# Patient Record
Sex: Male | Born: 1955
Health system: Southern US, Community
[De-identification: ages and names within clinical notes are randomized; demographics above are authoritative.]

## PROBLEM LIST (undated history)

## (undated) DIAGNOSIS — M797 Fibromyalgia: Secondary | ICD-10-CM

## (undated) DIAGNOSIS — E782 Mixed hyperlipidemia: Secondary | ICD-10-CM

## (undated) DIAGNOSIS — I10 Essential (primary) hypertension: Secondary | ICD-10-CM

## (undated) HISTORY — PX: FOOT SURGERY: SHX648

## (undated) HISTORY — PX: HERNIA REPAIR: SHX51

---

## 2006-02-12 ENCOUNTER — Ambulatory Visit: Payer: Self-pay | Admitting: Internal Medicine

## 2006-03-19 ENCOUNTER — Ambulatory Visit: Payer: Self-pay | Admitting: Internal Medicine

## 2006-03-29 ENCOUNTER — Ambulatory Visit: Payer: Self-pay | Admitting: Internal Medicine

## 2006-04-20 ENCOUNTER — Ambulatory Visit: Payer: Self-pay | Admitting: Internal Medicine

## 2006-04-27 ENCOUNTER — Ambulatory Visit: Payer: Self-pay | Admitting: Internal Medicine

## 2006-05-04 ENCOUNTER — Ambulatory Visit: Payer: Self-pay | Admitting: Internal Medicine

## 2006-06-05 ENCOUNTER — Ambulatory Visit: Payer: Self-pay | Admitting: Internal Medicine

## 2006-07-27 ENCOUNTER — Ambulatory Visit: Payer: Self-pay | Admitting: Internal Medicine

## 2010-07-24 ENCOUNTER — Ambulatory Visit: Payer: Self-pay | Admitting: Oncology

## 2010-07-30 LAB — CBC WITH DIFFERENTIAL/PLATELET
BASO%: 0.7 % (ref 0.0–2.0)
HCT: 38.5 % (ref 38.4–49.9)
LYMPH%: 29.3 % (ref 14.0–49.0)
MCH: 31.5 pg (ref 27.2–33.4)
MCHC: 34.5 g/dL (ref 32.0–36.0)
MCV: 91.2 fL (ref 79.3–98.0)
MONO#: 0.6 10*3/uL (ref 0.1–0.9)
MONO%: 7.8 % (ref 0.0–14.0)
NEUT%: 54.1 % (ref 39.0–75.0)
Platelets: 291 10*3/uL (ref 140–400)
WBC: 7.1 10*3/uL (ref 4.0–10.3)

## 2010-07-30 LAB — CHCC SMEAR

## 2010-08-01 LAB — COMPREHENSIVE METABOLIC PANEL
ALT: 13 U/L (ref 0–53)
CO2: 27 mEq/L (ref 19–32)
Calcium: 9.3 mg/dL (ref 8.4–10.5)
Chloride: 100 mEq/L (ref 96–112)
Creatinine, Ser: 0.86 mg/dL (ref 0.40–1.50)
Sodium: 136 mEq/L (ref 135–145)
Total Protein: 6.7 g/dL (ref 6.0–8.3)

## 2010-08-01 LAB — LACTATE DEHYDROGENASE: LDH: 162 U/L (ref 94–250)

## 2010-08-05 ENCOUNTER — Encounter
Admission: RE | Admit: 2010-08-05 | Discharge: 2010-08-05 | Payer: Self-pay | Admitting: Physical Medicine and Rehabilitation

## 2011-03-13 NOTE — Assessment & Plan Note (Signed)
[facility]                                   ON-CALL NOTE   [name], [name]                        MRN:          [id number]  DATE:06/05/2006                            DOB:          1955-11-27    PHONE NUMBER:  [phone]   PRIMARY CARE PHYSICIAN:  Valetta Mole. Swords, MD   SUBJECTIVE:  Phone call was at about 10:30 a.m. on June 05, 2006.  Mr.  Alexander Reyes is concerned because he is having some abdominal or inguinal  sharp pain.  He describes a tubular structure that he has noticed come in  and out and also Dr. [name] who he is seeing for headache has seen.  It is  giving him some more pain today, but he does describe that he is able to  push it back into his abdomen.   PLAN:  It clearly sounds like an inguinal hernia.  Since he is having some  pain we are going to go ahead and see him in the office today at 12:45 p.m.                                   Karie Schwalbe, MD   RIL/MedQ  DD:  06/05/2006  DT:  06/06/2006  Job #:  [id number]   cc:   Valetta Mole. Swords, MD

## 2014-05-17 ENCOUNTER — Other Ambulatory Visit: Payer: Self-pay | Admitting: Infectious Diseases

## 2014-05-17 DIAGNOSIS — M545 Low back pain, unspecified: Secondary | ICD-10-CM

## 2014-05-23 ENCOUNTER — Ambulatory Visit
Admission: RE | Admit: 2014-05-23 | Discharge: 2014-05-23 | Disposition: A | Discharge status: Home / Self Care | Payer: BC Managed Care – PPO | Source: Ambulatory Visit | Attending: Infectious Diseases | Admitting: Infectious Diseases

## 2014-05-23 DIAGNOSIS — M545 Low back pain, unspecified: Secondary | ICD-10-CM

## 2015-06-27 ENCOUNTER — Other Ambulatory Visit: Payer: Self-pay | Admitting: Physician Assistant

## 2015-06-27 ENCOUNTER — Ambulatory Visit
Admission: RE | Admit: 2015-06-27 | Discharge: 2015-06-27 | Disposition: A | Discharge status: Home / Self Care | Payer: BLUE CROSS/BLUE SHIELD | Source: Ambulatory Visit | Attending: Physician Assistant | Admitting: Physician Assistant

## 2015-06-27 DIAGNOSIS — M542 Cervicalgia: Secondary | ICD-10-CM

## 2016-01-23 ENCOUNTER — Emergency Department (HOSPITAL_COMMUNITY): Payer: BLUE CROSS/BLUE SHIELD

## 2016-01-23 ENCOUNTER — Emergency Department (HOSPITAL_COMMUNITY)
Admission: EM | Admit: 2016-01-23 | Discharge: 2016-01-23 | Disposition: A | Discharge status: Home / Self Care | Payer: BLUE CROSS/BLUE SHIELD | Attending: Emergency Medicine | Admitting: Emergency Medicine

## 2016-01-23 ENCOUNTER — Encounter (HOSPITAL_COMMUNITY): Payer: Self-pay | Admitting: Emergency Medicine

## 2016-01-23 DIAGNOSIS — Z79899 Other long term (current) drug therapy: Secondary | ICD-10-CM | POA: Insufficient documentation

## 2016-01-23 DIAGNOSIS — R000 Tachycardia, unspecified: Secondary | ICD-10-CM | POA: Diagnosis present

## 2016-01-23 DIAGNOSIS — M797 Fibromyalgia: Secondary | ICD-10-CM | POA: Insufficient documentation

## 2016-01-23 DIAGNOSIS — E782 Mixed hyperlipidemia: Secondary | ICD-10-CM | POA: Diagnosis not present

## 2016-01-23 DIAGNOSIS — I10 Essential (primary) hypertension: Secondary | ICD-10-CM | POA: Insufficient documentation

## 2016-01-23 DIAGNOSIS — I4891 Unspecified atrial fibrillation: Secondary | ICD-10-CM

## 2016-01-23 HISTORY — DX: Fibromyalgia: M79.7

## 2016-01-23 HISTORY — DX: Mixed hyperlipidemia: E78.2

## 2016-01-23 HISTORY — DX: Essential (primary) hypertension: I10

## 2016-01-23 LAB — CBC
HEMATOCRIT: 50.9 % (ref 39.0–52.0)
HEMOGLOBIN: 18.2 g/dL — AB (ref 13.0–17.0)
MCH: 33.5 pg (ref 26.0–34.0)
MCHC: 35.8 g/dL (ref 30.0–36.0)
MCV: 93.6 fL (ref 78.0–100.0)
Platelets: 290 10*3/uL (ref 150–400)
RBC: 5.44 MIL/uL (ref 4.22–5.81)
RDW: 12.1 % (ref 11.5–15.5)
WBC: 7.5 10*3/uL (ref 4.0–10.5)

## 2016-01-23 LAB — BASIC METABOLIC PANEL
ANION GAP: 11 (ref 5–15)
BUN: 12 mg/dL (ref 6–20)
CHLORIDE: 105 mmol/L (ref 101–111)
CO2: 25 mmol/L (ref 22–32)
Calcium: 9.6 mg/dL (ref 8.9–10.3)
Creatinine, Ser: 1.06 mg/dL (ref 0.61–1.24)
GFR calc Af Amer: >60 mL/min (ref >60)
GLUCOSE: 111 mg/dL — AB (ref 65–99)
POTASSIUM: 4.3 mmol/L (ref 3.5–5.1)
SODIUM: 141 mmol/L (ref 135–145)

## 2016-01-23 LAB — PROTIME-INR
INR: 1.02 (ref 0.00–1.49)
Prothrombin Time: 13.6 seconds (ref 11.6–15.2)

## 2016-01-23 LAB — TROPONIN I

## 2016-01-23 LAB — MAGNESIUM: Magnesium: 2 mg/dL (ref 1.7–2.4)

## 2016-01-23 MED ORDER — DILTIAZEM LOAD VIA INFUSION
20.0000 mg | Freq: Once | INTRAVENOUS | Status: AC
  Administered 2016-01-23: 20 mg via INTRAVENOUS
  Filled 2016-01-23: qty 20

## 2016-01-23 MED ORDER — DILTIAZEM HCL 60 MG PO TABS: 60.0000 mg | ORAL_TABLET | Freq: Once | ORAL | Status: DC | PRN

## 2016-01-23 MED ORDER — METOPROLOL SUCCINATE ER 100 MG PO TB24: 50.0000 mg | ORAL_TABLET | Freq: Every day | ORAL | Status: DC

## 2016-01-23 MED ORDER — DILTIAZEM HCL 100 MG IV SOLR
5.0000 mg/h | INTRAVENOUS | Status: DC
  Administered 2016-01-23: 5 mg/h via INTRAVENOUS
  Filled 2016-01-23: qty 100

## 2016-01-23 NOTE — ED Provider Notes (Addendum)
CSN: [id number]     Arrival date & time 01/23/16  1148 History   First MD Initiated Contact with Patient 01/23/16 1149     Chief Complaint  Patient presents with  . Tachycardia   HPI Patient presents to the emergency room with complaints of atrial fibrillation. The patient states over the last couple of weeks. He's noticed some intermittent palpitations.  He felt like his heart has been racing off and on. He denies any trouble with any chest pain or shortness of breath. No fevers or chills. He went to his primary care doctor's office today and was noted to be in a tachycardic rhythm.  EKG suggesting atrial fibrillation. The doctor's office called EMS he was given a dose of 20 mg Cardizem IV.  Patient denies any prior history of irregular heart rhythms. He denies frequent or daily alcohol use Past Medical History  Diagnosis Date  . Hypertension   . Fibromyalgia   . Mixed dyslipidemia    Past Surgical History  Procedure Laterality Date  . Hernia repair     No family history on file. Social History  Substance Use Topics  . Smoking status: None  . Smokeless tobacco: None  . Alcohol Use: Yes     Comment: occasional    Review of Systems  All other systems reviewed and are negative.     Allergies  Review of patient's allergies indicates no known allergies.  Home Medications   Prior to Admission medications   Medication Sig Start Date End Date Taking? Authorizing Provider  amLODipine (NORVASC) 10 MG tablet Take 10 mg by mouth daily.   Yes Historical Provider, MD  atorvastatin (LIPITOR) 40 MG tablet Take 40 mg by mouth daily.   Yes Historical Provider, MD  COMBIGAN 0.2-0.5 % ophthalmic solution Place 1 drop into both eyes 2 (two) times daily. 11/26/15  Yes Historical Provider, MD  cyclobenzaprine (FLEXERIL) 5 MG tablet Take 5 mg by mouth daily as needed for muscle spasms.   Yes Historical Provider, MD  divalproex (DEPAKOTE ER) 250 MG 24 hr tablet Take 500 mg by mouth 2 (two) times  daily as needed.   Yes Historical Provider, MD  hydrochlorothiazide (HYDRODIURIL) 12.5 MG tablet Take 12.5 mg by mouth daily.   Yes Historical Provider, MD  lisinopril (PRINIVIL,ZESTRIL) 40 MG tablet Take 40 mg by mouth daily.   Yes Historical Provider, MD  metoprolol succinate (TOPROL-XL) 50 MG 24 hr tablet Take 50 mg by mouth daily. Take with or immediately following a meal.   Yes Historical Provider, MD  Omega-3 Fatty Acids (FISH OIL) 1000 MG CAPS Take 1,000 mg by mouth daily.   Yes Historical Provider, MD   BP 112/89 mmHg  Pulse 119  Temp(Src) 98.4 F (36.9 C) (Oral)  Resp 24  SpO2 95% Physical Exam  Constitutional: He appears well-developed and well-nourished. No distress.  HENT:  Head: Normocephalic and atraumatic.  Right Ear: External ear normal.  Left Ear: External ear normal.  Eyes: Conjunctivae are normal. Right eye exhibits no discharge. Left eye exhibits no discharge. No scleral icterus.  Neck: Neck supple. No tracheal deviation present. No thyromegaly present.  Cardiovascular: Intact distal pulses.  An irregularly irregular rhythm present. Tachycardia present.   Pulmonary/Chest: Effort normal and breath sounds normal. No stridor. No respiratory distress. He has no wheezes. He has no rales.  Abdominal: Soft. Bowel sounds are normal. He exhibits no distension. There is no tenderness. There is no rebound and no guarding.  Musculoskeletal: He exhibits no edema  or tenderness.  Neurological: He is alert. He has normal strength. No cranial nerve deficit (no facial droop, extraocular movements intact, no slurred speech) or sensory deficit. He exhibits normal muscle tone. He displays no seizure activity. Coordination normal.  Skin: Skin is warm and dry. No rash noted.  Psychiatric: He has a normal mood and affect.  Nursing note and vitals reviewed.   ED Course  Procedures (including critical care time)  CRITICAL CARE Performed by: AVWUJ,WJX Total critical care time: 35  minutes Critical care time was exclusive of separately billable procedures and treating other patients. Critical care was necessary to treat or prevent imminent or life-threatening deterioration. Critical care was time spent personally by me on the following activities: development of treatment plan with patient and/or surrogate as well as nursing, discussions with consultants, evaluation of patient's response to treatment, examination of patient, obtaining history from patient or surrogate, ordering and performing treatments and interventions, ordering and review of laboratory studies, ordering and review of radiographic studies, pulse oximetry and re-evaluation of patient's condition.  Labs Review Labs Reviewed  CBC - Abnormal; Notable for the following:    Hemoglobin 18.2 (*)    All other components within normal limits  BASIC METABOLIC PANEL - Abnormal; Notable for the following:    Glucose, Bld 111 (*)    All other components within normal limits  PROTIME-INR  TROPONIN I  MAGNESIUM    Imaging Review Dg Chest Port 1 View  01/23/2016  CLINICAL DATA:  Atrial fibrillation for approximately 2.5 weeks EXAM: PORTABLE CHEST 1 VIEW COMPARISON:  July 21, 2010 chest CT FINDINGS: There is no edema or consolidation. Heart is upper normal in size with pulmonary vascularity within normal limits. No adenopathy. No bone lesions. IMPRESSION: No edema or consolidation. Electronically Signed   By: Bretta Bang III M.D.   On: 01/23/2016 12:10   I have personally reviewed and evaluated these images and lab results as part of my medical decision-making.   EKG Interpretation   Date/Time:  Thursday January 23 2016 11:48:21 EDT Ventricular Rate:  154 PR Interval:    QRS Duration: 100 QT Interval:  283 QTC Calculation: 453 R Axis:   88 Text Interpretation:  Atrial fibrillation Borderline low voltage,  extremity leads No old tracing to compare Confirmed by Wong Steadham  MD-J, Nakyra Bourn  ([id number]) on 01/23/2016  12:01:32 PM      EKG Interpretation  Date/Time:  Thursday January 23 2016 14:58:53 EDT Ventricular Rate:  90 PR Interval:  151 QRS Duration: 102 QT Interval:  347 QTC Calculation: 424 R Axis:   90 Text Interpretation:  Sinus rhythm Borderline right axis deviation Borderline low voltage, extremity leads Baseline wander in lead(s) V1 atrial fibrillation resolved Confirmed by Lavanna Rog  MD-J, Jerrod Damiano ([id number]) on 01/23/2016 3:05:16 PM        MDM   Final diagnoses:  Atrial fibrillation with rapid ventricular response (HCC)    A fib noted on ECG.  Pt appears stable.  Will proceed with ED evaluation.  1426  Patient remains in A. fib. He is persistently tachycardic although slightly decreased since arrival.  He is tolerating the Cardizem drip. Blood pressure remained stable. He is currently at 15 mg an hour. We'll continue to monitor closely.  I will consult with cardiology for admission and further evaluation.     Linwood Dibbles, MD 01/23/16 1428  Pt has converted to NSR.  Rate is now 90.  Will dc Cardizem drip.  Chads Vasc score  = 1.  Pt  appears stable at this point for outpatient follow up .  Will discuss with cardiology about anticoagulation and follow up in the office.  D/w Dr Anne Fu.  Will increase his metoprolol to .  Follow up in a fib clinic.  Will have him take an aspirin daily and ultimately decide on anticoagulation with the cardiologist in the office.  Linwood Dibbles, MD 01/23/16 [phone]

## 2016-01-23 NOTE — Discharge Instructions (Signed)
Atrial Fibrillation °Atrial fibrillation is a type of heartbeat that is irregular or fast (rapid). If you have this condition, your heart keeps quivering in a weird (chaotic) way. This condition can make it so your heart cannot pump blood normally. Having this condition gives a person more risk for stroke, heart failure, and other heart problems. There are different types of atrial fibrillation. Talk with your doctor to learn about the type that you have. °HOME CARE °· Take over-the-counter and prescription medicines only as told by your doctor. °· If your doctor prescribed a blood-thinning medicine, take it exactly as told. Taking too much of it can cause bleeding. If you do not take enough of it, you will not have the protection that you need against stroke and other problems. °· Do not use any tobacco products. These include cigarettes, chewing tobacco, and e-cigarettes. If you need help quitting, ask your doctor. °· If you have apnea (obstructive sleep apnea), manage it as told by your doctor. °· Do not drink alcohol. °· Do not drink beverages that have caffeine. These include coffee, soda, and tea. °· Maintain a healthy weight. Do not use diet pills unless your doctor says they are safe for you. Diet pills may make heart problems worse. °· Follow diet instructions as told by your doctor. °· Exercise regularly as told by your doctor. °· Keep all follow-up visits as told by your doctor. This is important. °GET HELP IF: °· You notice a change in the speed, rhythm, or strength of your heartbeat. °· You are taking a blood-thinning medicine and you notice more bruising. °· You get tired more easily when you move or exercise. °GET HELP RIGHT AWAY IF: °· You have pain in your chest or your belly (abdomen). °· You have sweating or weakness. °· You feel sick to your stomach (nauseous). °· You notice blood in your throw up (vomit), poop (stool), or pee (urine). °· You are short of breath. °· You suddenly have swollen feet  and ankles. °· You feel dizzy. °· Your suddenly get weak or numb in your face, arms, or legs, especially if it happens on one side of your body. °· You have trouble talking, trouble understanding, or both. °· Your face or your eyelid droops on one side. °These symptoms may be an emergency. Do not wait to see if the symptoms will go away. Get medical help right away. Call your local emergency services (911 in the [place] Do not drive yourself to the hospital. °  °This information is not intended to replace advice given to you by your health care provider. Make sure you discuss any questions you have with your health care provider. °  °Document Released: 07/21/2008 Document Revised: 07/03/2015 Document Reviewed: 02/06/2015 °Elsevier Interactive Patient Education ©2016 Elsevier Inc. ° °

## 2016-01-23 NOTE — ED Notes (Signed)
Per EMS, pt presents with intermittent palpatations x 2 weeks. Pt went to PCP today and was found to be in a-fib with a rate of 150+. EMS gave pt 20mg  bolus of Cardizem with minimal change in HR. Pt alert x4. NAD at this time.

## 2016-01-23 NOTE — ED Notes (Signed)
Pt converted to NSR, EKG captured and showed to Dr. Lynelle DoctorKnapp.

## 2016-01-30 ENCOUNTER — Ambulatory Visit (INDEPENDENT_AMBULATORY_CARE_PROVIDER_SITE_OTHER): Payer: BLUE CROSS/BLUE SHIELD | Admitting: Cardiovascular Disease

## 2016-01-30 ENCOUNTER — Encounter: Payer: Self-pay | Admitting: Cardiovascular Disease

## 2016-01-30 VITALS — BP 136/106 | HR 64 | Ht 68.9 in | Wt 173.4 lb

## 2016-01-30 DIAGNOSIS — I480 Paroxysmal atrial fibrillation: Secondary | ICD-10-CM

## 2016-01-30 DIAGNOSIS — E785 Hyperlipidemia, unspecified: Secondary | ICD-10-CM

## 2016-01-30 DIAGNOSIS — R9431 Abnormal electrocardiogram [ECG] [EKG]: Secondary | ICD-10-CM | POA: Diagnosis not present

## 2016-01-30 DIAGNOSIS — I10 Essential (primary) hypertension: Secondary | ICD-10-CM

## 2016-01-30 NOTE — Patient Instructions (Signed)
Medication Instructions: Your physician recommends that you continue on your current medications as directed. Please refer to the Current Medication list given to you today.  Labwork: NONE  Testing/Procedures: 1. Echocardiogram - Your physician has requested that you have an echocardiogram. Echocardiography is a painless test that uses sound waves to create images of your heart. It provides your doctor with information about the size and shape of your heart and how well your heart's chambers and valves are working. This procedure takes approximately one hour. There are no restrictions for this procedure. This will be done at [address] location - [address], [address].  2. Exercise Myoview - Your physician has requested that you have en exercise stress myoview. For further information please visit [email or web address]. Please follow instruction sheet, as given. HOLD Metoprolol the day of your stress test.  3. 24-hour Holter monitor - Your physician has recommended that you wear a holter monitor. Holter monitors are medical devices that record the heart's electrical activity. Doctors most often use these monitors to diagnose arrhythmias. Arrhythmias are problems with the speed or rhythm of the heartbeat. The monitor is a small, portable device. You can wear one while you do your normal daily activities. This is usually used to diagnose what is causing palpitations/syncope (passing out). [facility] you have the echocardiogram.  Follow-up: Dr Royann Shiversroitoru recommends that you schedule a follow-up appointment in 2-3 weeks.  If you need a refill on your cardiac medications before your next appointment, please call your pharmacy.

## 2016-01-30 NOTE — Progress Notes (Signed)
Cardiology Consultation Note    Date:  01/30/2016   ID:  Alexander Reyes, DOB [birth year], MRN [id number] Consult requested by: Levester Fresh. Earlene Plater, MD PCP:  Mickie Hillier, MD  Cardiologist:   Thurmon Fair, MD   Chief Complaint  Patient presents with  . 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 He mentioned very mild palpitations during a routine medical visits and his electrocardiogram showed atrial fibrillation with rapid response with rates up to 200 bpm. After receiving intravenous diltiazem in the emergency room his rate became slower and he eventually converted to normal sinus rhythm and was discharged from the emergency room. He never experienced dizziness, syncope, shortness of breath or angina pectoris. He was recovering from an upper respiratory tract infection around that time. He remembers having very brief palpitations, isolated to one or 2 beats in the past. He has never had sustained palpitations before  He has a long-standing history of systemic hypertension dating back at least 20 years. He has strong family history of hypertension on both maternal and paternal sides. Roughly 15 years ago his routine electrocardiogram, performed as part of yearly physicals for hilar license showed "T-wave inversion". He remembers going to [facility] and undergoing a stress test and possibly also an echocardiogram. The results were normal and he was released back to flying he has never had a myocardial infarction, congestive heart failure, stroke, peripheral embolic events, TIA or other major serious medical problems. He does take a statin for hyperlipidemia. He does not smoke he does not have diabetes mellitus.  He has  been keeping occasional records of his blood pressure with a home monitor, which has reportedly been found to be accurate in an office visit in the past. The typical blood pressure would be around 135-140/90 with frequent episodes of diastolic blood pressure in excess of 90 mmHg even up to 100 mmHg. On arrival to the office today his blood pressure is 136/106 m Hg, when I rechecked it it was 140/100 mmHg. His dose of metoprolol was doubled after his emergency room visit.  He is active and fit. He walks 2 miles in 30 minutes at least twice a week, sometimes more often. He denies exertional dyspnea or angina.  He is currently on a 30 day leave until his medical condition is clarified.  Past Medical History  Diagnosis Date  . Hypertension   . Fibromyalgia   . Mixed dyslipidemia     Past Surgical History  Procedure Laterality Date  . Hernia repair      Current Medications: Outpatient Prescriptions Prior to Visit  Medication Sig Dispense Refill  . amLODipine (NORVASC) 10 MG tablet Take 10 mg by mouth daily.    Marland Kitchen atorvastatin (LIPITOR) 40 MG tablet Take 40 mg by mouth daily.    . COMBIGAN 0.2-0.5 % ophthalmic solution Place 1 drop into both eyes 2 (two) times daily.    Marland Kitchen diltiazem (CARDIZEM) 60 MG tablet Take 1 tablet (60 mg total) by mouth once as needed (for episodes of rapid heart rate as discussed). 10 tablet 0  . hydrochlorothiazide (HYDRODIURIL) 12.5 MG tablet Take 12.5 mg by mouth daily.    Marland Kitchen lisinopril (PRINIVIL,ZESTRIL) 40 MG tablet Take 40 mg by  mouth daily.    . metoprolol succinate (TOPROL-XL) 100 MG 24 hr tablet Take 1 tablet (100 mg total) by mouth daily. Take with or immediately following a meal. 30 tablet 1  . Omega-3 Fatty Acids (FISH OIL) 1000 MG CAPS Take 1,000 mg by mouth daily.    . cyclobenzaprine (FLEXERIL) 5 MG tablet Take 5 mg by mouth daily as needed for muscle spasms. Reported on 01/30/2016    . divalproex (DEPAKOTE ER) 250 MG 24 hr tablet Take 500 mg by mouth 2 (two)  times daily as needed. Reported on 01/30/2016     No facility-administered medications prior to visit.     Allergies:   Review of patient's allergies indicates no known allergies.   Social History   Social History  . Marital Status: Single    Spouse Name: N/A  . Number of Children: N/A  . Years of Education: N/A   Social History Main Topics  . Smoking status: Never Smoker   . Smokeless tobacco: Never Used  . Alcohol Use: 0.0 oz/week    0 Standard drinks or equivalent per week     Comment: occasional  . Drug Use: None  . Sexual Activity: Not Asked   Other Topics Concern  . None   Social History Narrative     Family History:  The patient's family history includes Heart attack in his father; Hypertension in his father and mother; Kidney disease in his brother, father, and sister.   ROS:   Please see the history of present illness.    ROS All other systems reviewed and are negative.   PHYSICAL EXAM:   VS:  BP 136/106 mmHg  Pulse 64  Ht 5' 8.9" (1.75 m)  Wt 78.642 kg (173 lb 6 oz)  BMI 25.68 kg/m2   GEN: Well nourished, well developed, in no acute distress HEENT: normal Neck: no JVD, carotid bruits, or masses Cardiac: RRR; no murmurs, rubs, or gallops,no edema  Respiratory:  clear to auscultation bilaterally, normal work of breathing GI: soft, nontender, nondistended, + BS MS: no deformity or atrophy Skin: warm and dry, no rash Neuro:  Alert and Oriented x 3, Strength and sensation are intact Psych: euthymic mood, full affect  Wt Readings from Last 3 Encounters:  01/30/16 78.642 kg (173 lb 6 oz)      Studies/Labs Reviewed:   EKG:  EKG is ordered today.  The ekg ordered today demonstrates Sinus rhythm. Voltage is low in the limb leads, but in the precordial leads seen meets criteria for left ventricular hypertrophy. ST segment depression with asymmetrical T-wave inversion is seen in the lateral leads in a pattern suggestive of left ventricular hypertrophy. There  is very subtle broadening of the QRS 100 ms. QTC normal 402 ms. There is no convincing evidence for left atrial abnormality.   Recent Labs: 01/23/2016: BUN 12; Creatinine, Ser 1.06; Hemoglobin 18.2*; Magnesium 2.0; Platelets 290; Potassium 4.3; Sodium 141   Lipid Panel No results found for: CHOL, TRIG, HDL, CHOLHDL, VLDL, LDLCALC, LDLDIRECT  Additional studies/ records that were reviewed today include:  ED visit notes  ASSESSMENT:    1. Paroxysmal atrial fibrillation (HCC)   2. Essential hypertension   3. Hyperlipidemia   4. Abnormal resting ECG findings      PLAN:  In order of problems listed above:   1. AFib: Beta blocker dose was increased after his acute event and hopefully this will provide improved rate control during future episodes of arrhythmia. Antiarrhythmic medications do not appear to  be justified since this was his first clinical event, he was minimally symptomatic. This patients CHA2DS2-VASc Score and unadjusted Ischemic Stroke Rate (% per year) is equal to 0.6 % stroke rate/year from a score of 1. From a medical point of view, guidelines to suggest that aspirin may be sufficient for stroke prophylaxis. However looking through pilot credentialing medical guidelines that he provided me, it seems that any additional risk factors for embolic events would demand treatment with a full anticoagulant. 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 He has never had any serious bleeding problems. Also recommended for his workup is a 24 Holter monitor in order to maintain licensure. 2. HTN: He has ECG evidence suggestive of left ventricular hypertrophy and hypertensive cardiomyopathy is the most likely underlying substrate for his  atrial fibrillation. We'll check an echocardiogram to look for left ventricular hypertrophy and measure left atrial size. His physical exam suggests normal left ventricular systolic function and the absence of serious valvular abnormalities. If he has severe left atrial enlargement, this would probably push towards recommendation for full anticoagulation as well. Blood pressure control is borderline at best. To some degree this may be related to anxiety about his new medical condition. No changes are made to his medications today. Note that his beta blocker dose was increased very recently. Would probably wait another week before making additional changes to antihypertensives. The logical change would be to increase the hydrochlorothiazide to 25 mg daily if necessary. 3. HLP: He is on a highly effective statin and a reasonably high dose. I don't have his most recent lipid profile. In the absence of known coronary artery vascular problems, target LDL cholesterol less than 045 4. Abnormal ECG: ECG changes are primarily suspicious for left ventricular hypertrophy, but cannot exclude coronary artery disease. Treadmill stress testing with nuclear perfusion imaging is indicated since he has baseline ECG abnormalities. This is also required for his licensure.  He reports a recent normal TSH, but I do not find documentation in the electronic medical record.  Medication Adjustments/Labs and Tests Ordered: Current medicines are reviewed at length with the patient today.  Concerns regarding medicines are outlined above.  Medication changes, Labs and Tests ordered today are listed in the Patient Instructions below. There are no Patient Instructions on file for this visit.   Alexander Bimler, MD  01/30/2016 11:58 AM    [facility] [address] [place], [place], [place]  [address] Phone: [phone]; Fax: [phone]

## 2016-01-31 ENCOUNTER — Telehealth: Payer: Self-pay | Admitting: Cardiovascular Disease

## 2016-01-31 NOTE — Telephone Encounter (Signed)
Received Attending Physician Statement Fanny Dance(Symetra) via fax for Dr Royann Shiversroitoru to complete and sign. Sent to [facility] to send letter/packet to patient to obtain AUTH/PMT.  [facility] 01[facility]31/[facility]. lp

## 2016-02-03 ENCOUNTER — Telehealth: Payer: Self-pay | Admitting: Cardiovascular Disease

## 2016-02-03 NOTE — Telephone Encounter (Signed)
Need clarification: pt calling about labwork - no labwork ordered in system - no recommendations in OV notes from 4/6.  Routed to Dr. Royann Shiversroitoru.

## 2016-02-03 NOTE — Telephone Encounter (Signed)
Pt called with questions about his, Thyroid function, TSH lever orders.   Please give pt a call about where to and when to have it done.

## 2016-02-04 ENCOUNTER — Ambulatory Visit (INDEPENDENT_AMBULATORY_CARE_PROVIDER_SITE_OTHER): Payer: BLUE CROSS/BLUE SHIELD

## 2016-02-04 DIAGNOSIS — I480 Paroxysmal atrial fibrillation: Secondary | ICD-10-CM

## 2016-02-04 DIAGNOSIS — R002 Palpitations: Secondary | ICD-10-CM | POA: Diagnosis not present

## 2016-02-04 NOTE — Telephone Encounter (Signed)
I agree .Marland Kitchen. Don't see a mention from Dr. Salena Saner about this. He is out of town this week. Would ask him again when he returns.  Dr. HRexene Edison

## 2016-02-05 NOTE — Telephone Encounter (Signed)
Called [facility] to request blood work to be faxed to [phone].

## 2016-02-05 NOTE — Telephone Encounter (Signed)
Labs received and [facility] mail for his review.

## 2016-02-06 ENCOUNTER — Telehealth (HOSPITAL_COMMUNITY): Payer: Self-pay

## 2016-02-06 NOTE — Telephone Encounter (Signed)
Encounter complete. 

## 2016-02-11 ENCOUNTER — Ambulatory Visit (HOSPITAL_COMMUNITY)
Admission: RE | Admit: 2016-02-11 | Discharge: 2016-02-11 | Disposition: A | Discharge status: Home / Self Care | Payer: BLUE CROSS/BLUE SHIELD | Source: Ambulatory Visit | Attending: Cardiology | Admitting: Cardiology

## 2016-02-11 DIAGNOSIS — R002 Palpitations: Secondary | ICD-10-CM | POA: Insufficient documentation

## 2016-02-11 DIAGNOSIS — R9431 Abnormal electrocardiogram [ECG] [EKG]: Secondary | ICD-10-CM | POA: Diagnosis not present

## 2016-02-11 DIAGNOSIS — I480 Paroxysmal atrial fibrillation: Secondary | ICD-10-CM | POA: Diagnosis not present

## 2016-02-11 DIAGNOSIS — Z8249 Family history of ischemic heart disease and other diseases of the circulatory system: Secondary | ICD-10-CM | POA: Insufficient documentation

## 2016-02-11 DIAGNOSIS — I10 Essential (primary) hypertension: Secondary | ICD-10-CM | POA: Insufficient documentation

## 2016-02-11 LAB — MYOCARDIAL PERFUSION IMAGING
CHL CUP MPHR: 163 {beats}/min
CHL CUP NUCLEAR SSS: 3
CSEPHR: 100 %
CSEPPHR: 164 {beats}/min
Estimated workload: 8.5 METS
Exercise duration (min): 7 min
LV dias vol: 89 mL (ref 62–150)
LVSYSVOL: 38 mL
RPE: 15
Rest HR: 85 {beats}/min
SDS: 1
SRS: 2
TID: 0.9

## 2016-02-11 MED ORDER — TECHNETIUM TC 99M SESTAMIBI GENERIC - CARDIOLITE
9.7000 | Freq: Once | INTRAVENOUS | Status: AC | PRN
  Administered 2016-02-11: 9.7 via INTRAVENOUS

## 2016-02-11 MED ORDER — TECHNETIUM TC 99M SESTAMIBI GENERIC - CARDIOLITE
31.2000 | Freq: Once | INTRAVENOUS | Status: AC | PRN
  Administered 2016-02-11: 31.2 via INTRAVENOUS

## 2016-02-13 DIAGNOSIS — G44229 Chronic tension-type headache, not intractable: Secondary | ICD-10-CM | POA: Diagnosis not present

## 2016-02-14 DIAGNOSIS — G44221 Chronic tension-type headache, intractable: Secondary | ICD-10-CM | POA: Diagnosis not present

## 2016-02-14 DIAGNOSIS — Z79899 Other long term (current) drug therapy: Secondary | ICD-10-CM | POA: Diagnosis not present

## 2016-02-17 ENCOUNTER — Other Ambulatory Visit: Payer: Self-pay

## 2016-02-17 ENCOUNTER — Ambulatory Visit (HOSPITAL_COMMUNITY): Payer: BLUE CROSS/BLUE SHIELD | Attending: Cardiovascular Disease

## 2016-02-17 DIAGNOSIS — I4891 Unspecified atrial fibrillation: Secondary | ICD-10-CM | POA: Diagnosis present

## 2016-02-17 DIAGNOSIS — I10 Essential (primary) hypertension: Secondary | ICD-10-CM | POA: Diagnosis not present

## 2016-02-17 DIAGNOSIS — E785 Hyperlipidemia, unspecified: Secondary | ICD-10-CM | POA: Diagnosis not present

## 2016-02-17 DIAGNOSIS — I480 Paroxysmal atrial fibrillation: Secondary | ICD-10-CM | POA: Diagnosis not present

## 2016-02-18 ENCOUNTER — Telehealth: Payer: Self-pay | Admitting: Cardiovascular Disease

## 2016-02-18 NOTE — Telephone Encounter (Signed)
New message      Pt is thinking he is to have labs drawn.  Please call and let him know if he is due to have labs drawn.  He was to have 4 test scheduled---echo, stress test , monitor and labs.  Please call

## 2016-02-18 NOTE — Telephone Encounter (Signed)
Patient does not need labs drawn. Patient aware.  We received labs from patient's primary care physician and these were reviewed by Dr C.

## 2016-02-18 NOTE — Telephone Encounter (Signed)
Dr. Royann Shiversroitoru, please advise - looks like he has return appt w/ you on 5/3, no lab orders in system.

## 2016-02-20 ENCOUNTER — Telehealth: Payer: Self-pay | Admitting: Cardiovascular Disease

## 2016-02-20 NOTE — Telephone Encounter (Signed)
Received Signed FMLA form and Attending Physician Statement back from Dr Royann Shiversroitoru on 02/19/16.  Notified patient and faxed .

## 2016-02-25 NOTE — Progress Notes (Signed)
Patient ID: Alexander Reyes, male   DOB: 03/09/1956, 60 y.o.   MRN: [id number]     Cardiology Consultation Note    Date:  02/26/2016   ID:  Alexander Reyes, DOB 1955-12-28, MRN [id number] Consult requested by: Levester Fresh. Earlene Plater, MD PCP:  Mickie Hillier, MD  Cardiologist:   Thurmon Fair, MD   Chief Complaint  Patient presents with  . 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 He has occasional palpitations, none of them sustained. He denies dizziness, syncope, dyspnea at rest or with activity, chest discomfort at rest with activity, focal neurological complaints, bleeding problems, leg edema, claudication.  A 24-hour Holter monitor showed extremely rare PACs and PVCs and was otherwise normal. An exercise myocardial perfusion study was completely normal. EF was 57%. He was able to exercise for 7 minutes and 30 seconds. Echocardiogram also showed normal left ventricular systolic function and there were no valvular abnormalities. Left ventricular walls are borderline hypertrophic. The left atrial diameter was 37 mm, at the upper limit of normal in size. While there was no overt diastolic dysfunction, his tissue Doppler early diastolic velocities were slightly low. Estimated PA pressure was normal.  He has a long-standing history of systemic hypertension dating back at least 20 years. He has strong family history of hypertension on both maternal and paternal sides. 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 The workup was normal. He has never had a myocardial infarction, congestive heart failure, stroke, peripheral embolic events, TIA or other major serious medical problems. He does take a  statin for hyperlipidemia. He does not smoke he does not have diabetes mellitus.  His diastolic blood pressure was quite elevated at his last appointment over 100 mmHg, but he was quite nervous.  He is active and fit. He walks 2 miles in 30 minutes at least twice a week, sometimes more often. He denies exertional dyspnea or angina. He is currently on a 30 day leave until his medical condition is clarified.  Past Medical History  Diagnosis Date  . Hypertension   . Fibromyalgia   . Mixed dyslipidemia     Past Surgical History  Procedure Laterality Date  . Hernia repair      2006  . Foot surgery      2009    Current Medications: Outpatient Prescriptions Prior to Visit  Medication Sig Dispense Refill  . amLODipine (NORVASC) 10 MG tablet Take 10 mg by mouth daily.    Marland Kitchen atorvastatin (LIPITOR) 40 MG tablet Take 40 mg by mouth daily.    . COMBIGAN 0.2-0.5 % ophthalmic solution Place 1 drop into both eyes 2 (two) times daily.    Marland Kitchen diltiazem (CARDIZEM) 60 MG tablet Take 1 tablet (60 mg total) by mouth once as needed (for episodes of rapid heart rate as discussed). 10 tablet 0  . hydrochlorothiazide (HYDRODIURIL) 12.5 MG tablet Take 12.5 mg by mouth daily.    Marland Kitchen lisinopril (PRINIVIL,ZESTRIL) 40 MG tablet Take 40 mg by mouth daily.    . metoprolol succinate (TOPROL-XL) 100 MG 24 hr tablet Take 1 tablet (100 mg total) by mouth daily. Take with or immediately following a meal. 30 tablet 1  . Omega-3 Fatty Acids (FISH OIL) 1000 MG CAPS  Take 1,000 mg by mouth daily.    Marland Kitchen aspirin 325 MG tablet Take 325 mg by mouth daily. Reported on 02/26/2016     No facility-administered medications prior to visit.     Allergies:   Review of patient's allergies indicates no known allergies.   Social History   Social History  . Marital Status: Single    Spouse Name: N/A  . Number of Children: N/A  . Years of Education: N/A   Social History Main Topics  . Smoking status: Never Smoker   . Smokeless  tobacco: Never Used  . Alcohol Use: 2.4 oz/week    4 Cans of beer per week     Comment: occasional  . Drug Use: No  . Sexual Activity: Not Asked   Other Topics Concern  . None   Social History Narrative     Family History:  The patient's family history includes Heart attack in his father; Hypertension in his father and mother; Kidney disease in his brother, father, and sister.   ROS:   Please see the history of present illness.    ROS All other systems reviewed and are negative.   PHYSICAL EXAM:   VS:  BP 132/85 mmHg  Pulse 60  Ht  (1.753 m)  Wt 79.493 kg (175 lb 4 oz)  BMI 25.87 kg/m2   GEN: Well nourished, well developed, in no acute distress HEENT: normal Neck: no JVD, carotid bruits, or masses Cardiac: RRR; no murmurs, rubs, or gallops,no edema  Respiratory:  clear to auscultation bilaterally, normal work of breathing GI: soft, nontender, nondistended, + BS MS: no deformity or atrophy Skin: warm and dry, no rash Neuro:  Alert and Oriented x 3, Strength and sensation are intact Psych: euthymic mood, full affect  Wt Readings from Last 3 Encounters:  02/26/16 79.493 kg (175 lb 4 oz)  02/11/16 78.472 kg (173 lb)  01/30/16 78.642 kg (173 lb 6 oz)      Studies/Labs Reviewed:   EKG:  EKG is ordered today.  The ekg ordered At his last appointment demonstrates Sinus rhythm. Voltage is low in the limb leads, but in the precordial leads meets criteria for left ventricular hypertrophy. ST segment depression with asymmetrical T-wave inversion is seen in the lateral leads in a pattern suggestive of left ventricular hypertrophy. There is very subtle broadening of the QRS 100 ms. QTC normal 402 ms. There is no convincing evidence for left atrial abnormality.   Recent Labs: 01/23/2016: BUN 12; Creatinine, Ser 1.06; Hemoglobin 18.2*; Magnesium 2.0; Platelets 290; Potassium 4.3; Sodium 141   Lipid Panel No results found for: CHOL, TRIG, HDL, CHOLHDL, VLDL, LDLCALC,  LDLDIRECT  Additional studies/ records that were reviewed today include:  ED visit notes  ASSESSMENT:    1. Paroxysmal atrial fibrillation (HCC)   2. Essential hypertension   3. Hyperlipidemia      PLAN:  In order of problems listed above:   1. AFib: Beta blocker dose was increased after his acute event and hopefully this will provide improved rate control during future episodes of arrhythmia. Antiarrhythmic medications do not appear to be justified since this was his first clinical event, he was minimally symptomatic. TSH was normal.This patients CHA2DS2-VASc Score and unadjusted Ischemic Stroke Rate (% per year) is equal to 0.6 % stroke rate/year from a score of 1. His left atrium is not markedly dilated. From a medical point of view, guidelines to suggest that Either anticoagulation or aspirin may be sufficient for stroke prophylaxis. However  looking through pilot credentialing medical guidelines that he provided me, it seems that any additional risk factors for embolic events would demand treatment with a full anticoagulant. Stop aspirin and start Xarelto 20 mg daily with a meal. 2. HTN: He has ECG evidence suggestive of left ventricular hypertrophy and mild hypertensive cardiomyopathy is the most likely underlying substrate for his atrial fibrillation. ECG changes are due to left ventricular hypertrophy, not ischemia. Blood pressure control is borderline at best. Although there was no overt LVH by echo, the upper normal size of his left atrium and the depressed tissue Doppler velocity suggest that he does have diastolic dysfunction. Today his blood pressure is well controlled  3. HLP: He is on a highly effective statin and a reasonably high dose. I don't have his most recent lipid profile. In the absence of known coronary artery vascular problems, target LDL cholesterol less than 161100  Medication Adjustments/Labs and Tests Ordered: Current medicines are reviewed at length with the patient  today.  Concerns regarding medicines are outlined above.  Medication changes, Labs and Tests ordered today are listed in the Patient Instructions below. Patient Instructions  Dr Royann Shiversroitoru has recommended making the following medication changes: 1. START Xarelto 20 mg - take 1 tablet by mouth daily, usually with supper or largest meal 2. STOP Aspirin  Dr Royann Shiversroitoru recommends that you schedule a follow-up appointment in 2-3 months.  If you need a refill on your cardiac medications before your next appointment, please call your pharmacy.     Joie BimlerSigned, Duayne Brideau, MD  02/26/2016 11:32 AM    [facility] [address], [place], [place]  [address] Phone: [phone](336) [phone]; Fax: [phone]) [phone]

## 2016-02-26 ENCOUNTER — Ambulatory Visit (INDEPENDENT_AMBULATORY_CARE_PROVIDER_SITE_OTHER): Payer: BLUE CROSS/BLUE SHIELD | Admitting: Cardiovascular Disease

## 2016-02-26 ENCOUNTER — Encounter: Payer: Self-pay | Admitting: Cardiovascular Disease

## 2016-02-26 VITALS — BP 132/85 | HR 60 | Ht 69.0 in | Wt 175.2 lb

## 2016-02-26 DIAGNOSIS — I480 Paroxysmal atrial fibrillation: Secondary | ICD-10-CM | POA: Diagnosis not present

## 2016-02-26 DIAGNOSIS — E785 Hyperlipidemia, unspecified: Secondary | ICD-10-CM | POA: Diagnosis not present

## 2016-02-26 DIAGNOSIS — I10 Essential (primary) hypertension: Secondary | ICD-10-CM | POA: Diagnosis not present

## 2016-02-26 MED ORDER — RIVAROXABAN 20 MG PO TABS: 20.0000 mg | ORAL_TABLET | Freq: Every day | ORAL | Status: DC

## 2016-02-26 NOTE — Patient Instructions (Addendum)
Dr Royann Shiversroitoru has recommended making the following medication changes: 1. START Xarelto 20 mg - take 1 tablet by mouth daily, usually with supper or largest meal 2. STOP Aspirin  Dr Royann Shiversroitoru recommends that you schedule a follow-up appointment in 2-3 months.  If you need a refill on your cardiac medications before your next appointment, please call your pharmacy.

## 2016-03-03 ENCOUNTER — Encounter: Payer: Self-pay | Admitting: Cardiovascular Disease

## 2016-03-04 ENCOUNTER — Other Ambulatory Visit: Payer: Self-pay | Admitting: Family Medicine

## 2016-03-04 ENCOUNTER — Ambulatory Visit
Admission: RE | Admit: 2016-03-04 | Discharge: 2016-03-04 | Disposition: A | Discharge status: Home / Self Care | Payer: BLUE CROSS/BLUE SHIELD | Source: Ambulatory Visit | Attending: Family Medicine | Admitting: Family Medicine

## 2016-03-04 DIAGNOSIS — M545 Low back pain: Secondary | ICD-10-CM | POA: Diagnosis not present

## 2016-03-04 DIAGNOSIS — M546 Pain in thoracic spine: Secondary | ICD-10-CM | POA: Diagnosis not present

## 2016-03-11 ENCOUNTER — Encounter: Payer: Self-pay | Admitting: Cardiovascular Disease

## 2016-03-11 ENCOUNTER — Other Ambulatory Visit: Payer: Self-pay | Admitting: *Deleted

## 2016-04-16 DIAGNOSIS — M25512 Pain in left shoulder: Secondary | ICD-10-CM | POA: Diagnosis not present

## 2016-04-20 ENCOUNTER — Telehealth: Payer: Self-pay | Admitting: Cardiovascular Disease

## 2016-04-20 NOTE — Telephone Encounter (Signed)
Spoke with pt, he is going [facility] by the office for dr croitoru to review.

## 2016-04-20 NOTE — Telephone Encounter (Signed)
Pt calling regarding [employer] needing results of 24 hour monitor-summary sheet-tabular report-and tracings-current treatment plan for his condition-clinical history-current meds-and prognosis  Pt needs to pick up when ready-pls call [phone]

## 2016-04-21 DIAGNOSIS — H40013 Open angle with borderline findings, low risk, bilateral: Secondary | ICD-10-CM | POA: Diagnosis not present

## 2016-04-30 ENCOUNTER — Encounter: Payer: Self-pay | Admitting: Cardiovascular Disease

## 2016-04-30 ENCOUNTER — Telehealth: Payer: Self-pay

## 2016-04-30 NOTE — Telephone Encounter (Signed)
Patient states he is no taking aspirin. Medication list updated.

## 2016-06-03 ENCOUNTER — Encounter: Payer: Self-pay | Admitting: Cardiovascular Disease

## 2016-06-03 ENCOUNTER — Ambulatory Visit (INDEPENDENT_AMBULATORY_CARE_PROVIDER_SITE_OTHER): Payer: BLUE CROSS/BLUE SHIELD | Admitting: Cardiovascular Disease

## 2016-06-03 VITALS — BP 118/80 | HR 62 | Ht 69.0 in | Wt 178.0 lb

## 2016-06-03 DIAGNOSIS — I480 Paroxysmal atrial fibrillation: Secondary | ICD-10-CM

## 2016-06-03 DIAGNOSIS — E785 Hyperlipidemia, unspecified: Secondary | ICD-10-CM

## 2016-06-03 DIAGNOSIS — I10 Essential (primary) hypertension: Secondary | ICD-10-CM

## 2016-06-03 DIAGNOSIS — E782 Mixed hyperlipidemia: Secondary | ICD-10-CM | POA: Diagnosis not present

## 2016-06-03 NOTE — Progress Notes (Signed)
Patient ID: Alexander Reyes, male   DOB: 08-09-1956, 60 y.o.   MRN: [id number]     Cardiology Consultation Note    Date:  06/03/2016   ID:  Alexander Reyes, DOB [birth year], MRN [id number] Consult requested by: Levester Fresh. Earlene Plater, MD PCP:  Mickie Hillier, MD  Cardiologist:   Thurmon Fair, MD   Chief Complaint  Patient presents with  . 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 He has not had any new palpitations and denies dizziness, syncope, dyspnea at rest or with activity, chest discomfort at rest with activity, focal neurological complaints, bleeding problems, leg edema, claudication. Compliant with his anticoagulant. The pressure control is excellent.  He is active and fit. He walks 2 miles in 30 minutes at least twice a week, sometimes more often. He denies exertional dyspnea or angina. He is currently on a 30 day leave until his medical condition is clarified. 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  A 24-hour Holter monitor showed extremely rare PACs and PVCs and was otherwise normal. An exercise myocardial perfusion study was completely normal. EF was 57%. He was able to exercise for 7 minutes and 30 seconds. Echocardiogram also showed normal left ventricular systolic function and there were no valvular abnormalities. Left ventricular walls are borderline hypertrophic. The left atrial diameter was 37 mm, at the upper limit of normal in size. While there was no overt diastolic dysfunction, his tissue Doppler early diastolic velocities were slightly low. Estimated PA pressure was normal.  He has a long-standing history of systemic hypertension dating back at least 20 years. He has strong family history of hypertension on both maternal and paternal sides. 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 The workup was normal. He has never had a myocardial infarction, congestive heart failure, stroke, peripheral embolic events, TIA or other major serious medical problems. He does take a statin for hyperlipidemia. He does not smoke he does not have diabetes mellitus.    Past Medical History:  Diagnosis Date  . Fibromyalgia   . Hypertension   . Mixed dyslipidemia     Past Surgical History:  Procedure Laterality Date  . FOOT SURGERY     2009  . HERNIA REPAIR     2006    Current Medications: Outpatient Medications Prior to Visit  Medication Sig Dispense Refill  . amLODipine (NORVASC) 10 MG tablet Take 10 mg by mouth daily.    Marland Kitchen atorvastatin (LIPITOR) 40 MG tablet Take 40 mg by mouth daily.    Marland Kitchen diltiazem (CARDIZEM) 60 MG tablet Take 1 tablet (60 mg total) by mouth once as needed (for episodes of rapid heart rate as discussed). 10 tablet 0  . hydrochlorothiazide (HYDRODIURIL) 12.5 MG tablet Take 12.5 mg by mouth daily.    Marland Kitchen lisinopril (PRINIVIL,ZESTRIL) 40 MG tablet Take 40 mg by mouth daily.    . metoprolol succinate (TOPROL-XL) 100 MG 24 hr tablet Take 1 tablet (100 mg total) by mouth daily. Take with or immediately following a meal. 30 tablet 1  . Omega-3 Fatty Acids (FISH OIL) 1000 MG CAPS Take 1,000 mg by mouth daily.    . rivaroxaban (XARELTO) 20 MG TABS  tablet Take 1 tablet (20 mg total) by mouth daily with supper. 30 tablet 11   No facility-administered medications prior to visit.      Allergies:   Review of patient's allergies indicates no known allergies.   Social History   Social History  . Marital status: Single    Spouse name: N/A  . Number of children: N/A  . Years of education: N/A   Social History Main Topics  . Smoking status: Never Smoker  . Smokeless tobacco: Never Used  . Alcohol use 2.4 oz/week    4 Cans of beer per week       Comment: occasional  . Drug use: No  . Sexual activity: Not Asked   Other Topics Concern  . None   Social History Narrative  . None     Family History:  The patient's family history includes Heart attack in his father; Hypertension in his father and mother; Kidney disease in his brother, father, and sister.   ROS:   Please see the history of present illness.    ROS All other systems reviewed and are negative.   PHYSICAL EXAM:   VS:  BP 118/80   Pulse 62   Ht 5\' 9"  (1.753 m)   Wt 178 lb (80.7 kg)   BMI 26.29 kg/m    GEN: Well nourished, well developed, in no acute distress  HEENT: normal  Neck: no JVD, carotid bruits, or masses Cardiac: RRR; no murmurs, rubs, or gallops,no edema  Respiratory:  clear to auscultation bilaterally, normal work of breathing GI: soft, nontender, nondistended, + BS MS: no deformity or atrophy  Skin: warm and dry, no rash Neuro:  Alert and Oriented x 3, Strength and sensation are intact Psych: euthymic mood, full affect  Wt Readings from Last 3 Encounters:  06/03/16 178 lb (80.7 kg)  02/26/16 175 lb 4 oz (79.5 kg)  02/11/16 173 lb (78.5 kg)      Studies/Labs Reviewed:   EKG:  EKG is ordered today.  The ekg ordered At his last appointment demonstrates Sinus rhythm. Voltage is low in the limb leads, but in the precordial leads meets criteria for left ventricular hypertrophy. ST segment depression with asymmetrical T-wave inversion is seen in the lateral leads in a pattern suggestive of left ventricular hypertrophy. There is very subtle broadening of the QRS 100 ms. QTC normal 402 ms. There is no convincing evidence for left atrial abnormality.   Recent Labs: 01/23/2016: BUN 12; Creatinine, Ser 1.06; Hemoglobin 18.2; Magnesium 2.0; Platelets 290; Potassium 4.3; Sodium 141    ASSESSMENT:    1. Paroxysmal atrial fibrillation (HCC)   2. Essential hypertension   3. Hyperlipidemia      PLAN:  In order of problems listed above:   1.  AFib: CHA2DS2-VASc Score and unadjusted Ischemic Stroke Rate (% per year) is equal to 0.6 % stroke rate/year from a score of 1. His left atrium is not markedly dilated. Taking Xarelto 20 mg daily. On beta blockers for rate control. True antiarrhythmics are not justified. 2. HTN: He has ECG evidence suggestive of left ventricular hypertrophy and mild hypertensive cardiomyopathy is the most likely underlying substrate for his atrial fibrillation. ECG changes are due to left ventricular hypertrophy, not ischemia. Blood pressure control is good. Although there was no overt LVH by echo, the upper normal size of his left atrium and the depressed tissue Doppler velocity suggest that he does have diastolic dysfunction.  3. HLP: He is on a highly effective statin  and a reasonably high dose.  Medication Adjustments/Labs and Tests Ordered: Current medicines are reviewed at length with the patient today.  Concerns regarding medicines are outlined above.  Medication changes, Labs and Tests ordered today are listed in the Patient Instructions below. Patient Instructions  Dr Royann Shiversroitoru recommends that you schedule a follow-up appointment in 12 months. You will receive a reminder letter in the mail two months in advance. If you don't receive a letter, please call our office to schedule the follow-up appointment.  If you need a refill on your cardiac medications before your next appointment, please call your pharmacy.    Signed, Thurmon FairMihai Niyonna Betsill, MD  06/03/2016 1:33 PM    [facility] [address], [place], [place]  [address] Phone: [phone](336) [phone]; Fax: [phone]) [phone]

## 2016-06-03 NOTE — Patient Instructions (Signed)
Dr Croitoru recommends that you schedule a follow-up appointment in 12 months. You will receive a reminder letter in the mail two months in advance. If you don't receive a letter, please call our office to schedule the follow-up appointment.  If you need a refill on your cardiac medications before your next appointment, please call your pharmacy. 

## 2016-06-08 DIAGNOSIS — E291 Testicular hypofunction: Secondary | ICD-10-CM | POA: Diagnosis not present

## 2016-06-08 DIAGNOSIS — I10 Essential (primary) hypertension: Secondary | ICD-10-CM | POA: Diagnosis not present

## 2016-06-08 DIAGNOSIS — E782 Mixed hyperlipidemia: Secondary | ICD-10-CM | POA: Diagnosis not present

## 2016-06-18 DIAGNOSIS — G44221 Chronic tension-type headache, intractable: Secondary | ICD-10-CM | POA: Diagnosis not present

## 2016-07-22 DIAGNOSIS — G44221 Chronic tension-type headache, intractable: Secondary | ICD-10-CM | POA: Diagnosis not present

## 2016-07-22 DIAGNOSIS — Z79899 Other long term (current) drug therapy: Secondary | ICD-10-CM | POA: Diagnosis not present

## 2016-08-03 DIAGNOSIS — H40013 Open angle with borderline findings, low risk, bilateral: Secondary | ICD-10-CM | POA: Diagnosis not present

## 2016-08-11 DIAGNOSIS — E291 Testicular hypofunction: Secondary | ICD-10-CM | POA: Diagnosis not present

## 2016-08-20 DIAGNOSIS — R251 Tremor, unspecified: Secondary | ICD-10-CM | POA: Diagnosis not present

## 2016-08-20 DIAGNOSIS — G44221 Chronic tension-type headache, intractable: Secondary | ICD-10-CM | POA: Diagnosis not present

## 2016-09-08 ENCOUNTER — Telehealth: Payer: Self-pay | Admitting: Cardiovascular Disease

## 2016-09-08 DIAGNOSIS — I499 Cardiac arrhythmia, unspecified: Secondary | ICD-10-CM | POA: Diagnosis not present

## 2016-09-08 DIAGNOSIS — I4891 Unspecified atrial fibrillation: Secondary | ICD-10-CM | POA: Diagnosis not present

## 2016-09-08 DIAGNOSIS — J209 Acute bronchitis, unspecified: Secondary | ICD-10-CM | POA: Diagnosis not present

## 2016-09-08 DIAGNOSIS — R05 Cough: Secondary | ICD-10-CM | POA: Diagnosis not present

## 2016-09-10 ENCOUNTER — Other Ambulatory Visit: Payer: Self-pay

## 2016-09-10 MED ORDER — RIVAROXABAN 20 MG PO TABS: 20.0000 mg | ORAL_TABLET | Freq: Every day | ORAL | 2 refills | Status: DC

## 2016-09-10 NOTE — Telephone Encounter (Signed)
Dr Allyson SabalBerry Talked to Dr Juluis RainierElizabeth Barnes

## 2016-09-11 ENCOUNTER — Ambulatory Visit (INDEPENDENT_AMBULATORY_CARE_PROVIDER_SITE_OTHER): Payer: BLUE CROSS/BLUE SHIELD | Admitting: Cardiology

## 2016-09-11 ENCOUNTER — Encounter: Payer: Self-pay | Admitting: Cardiology

## 2016-09-11 VITALS — BP 110/78 | HR 58 | Ht 68.0 in | Wt 183.0 lb

## 2016-09-11 DIAGNOSIS — J209 Acute bronchitis, unspecified: Secondary | ICD-10-CM | POA: Diagnosis not present

## 2016-09-11 DIAGNOSIS — I10 Essential (primary) hypertension: Secondary | ICD-10-CM | POA: Diagnosis not present

## 2016-09-11 DIAGNOSIS — E785 Hyperlipidemia, unspecified: Secondary | ICD-10-CM | POA: Diagnosis not present

## 2016-09-11 DIAGNOSIS — I480 Paroxysmal atrial fibrillation: Secondary | ICD-10-CM

## 2016-09-11 NOTE — Assessment & Plan Note (Signed)
Sent to the office today for recent PAF with RVR in setting of bronchitis noted by PCP

## 2016-09-11 NOTE — Patient Instructions (Signed)
Medication Instructions:  Continue Cartia 120 mg until you over your illness then take as needed for rapid heart rate and A-Fib  Labwork: None Ordered  Testing/Procedures: None Ordered  Follow-Up: Your physician wants you to follow-up in: 6 Months with Dr Royann Shiversroitoru. You will receive a reminder letter in the mail two months in advance. If you don't receive a letter, please call our office to schedule the follow-up appointment.   Any Other Special Instructions Will Be Listed Below (If Applicable).      Happy Thanksgiving   If you need a refill on your cardiac medications before your next appointment, please call your pharmacy.

## 2016-09-11 NOTE — Assessment & Plan Note (Signed)
On statin, followed by PCP.

## 2016-09-11 NOTE — Assessment & Plan Note (Signed)
Controlled.  

## 2016-09-11 NOTE — Progress Notes (Signed)
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 Echo and Myoview unremarkable. TSH by PCP was WNL. Pt has HTN and HLD and is CHADs VASc=1. He is on Xarelto and is in NSR today but apparently in AF with RVR 2 days ago when he went to his PCP with bronchitis. He denies taking any OTC decongestants. He was unaware he was in AF. Alexander Reyes was added, he had previously been on Diltiazem 60 mg prn but had never taken it.    Current Outpatient Prescriptions  Medication Sig Dispense Refill  . amLODipine (NORVASC) 10 MG tablet Take 10 mg by mouth daily.    Marland Kitchen. atorvastatin (LIPITOR) 40 MG tablet Take 40 mg by mouth daily.    Marland Kitchen. CARTIA XT 120 MG 24 hr capsule Take 1 tablet by mouth daily.    Marland Kitchen. doxycycline (VIBRA-TABS) 100 MG tablet Take 1 tablet by mouth 2 (two) times daily.    . hydrochlorothiazide (HYDRODIURIL) 12.5 MG tablet Take 12.5 mg by mouth daily.    Marland Kitchen. lisinopril (PRINIVIL,ZESTRIL) 40 MG tablet Take 40 mg by mouth daily.    . metoprolol succinate (TOPROL-XL) 100 MG 24 hr tablet Take 1 tablet (100 mg total) by mouth daily. Take with or immediately following a meal. 30 tablet 1  . Omega-3 Fatty Acids (FISH OIL) 1000 MG CAPS Take 1,000 mg by mouth daily.    . rivaroxaban (XARELTO) 20 MG TABS tablet Take 1 tablet (20 mg total) by mouth daily with supper. 90 tablet 2   No current facility-administered medications for this visit.     No Known Allergies  Social History   Social History  . Marital status: Single    Spouse name: N/A  . Number of children: N/A  . Years of education: N/A   Occupational History  . Not on file.   Social History Main Topics  . Smoking status: Never Smoker  . Smokeless tobacco: Never Used  . Alcohol use 2.4 oz/week    4 Cans of beer per week     Comment: occasional    . Drug use: No  . Sexual activity: Not on file   Other Topics Concern  . Not on file   Social History Narrative  . No narrative on file     Review of Systems: General: negative for chills, fever, night sweats or weight changes.  Cardiovascular: negative for chest pain, dyspnea on exertion, edema, orthopnea, palpitations, paroxysmal nocturnal dyspnea or shortness of breath Dermatological: negative for rash Respiratory: negative for cough or wheezing Urologic: negative for hematuria Abdominal: negative for nausea, vomiting, diarrhea, bright red blood per rectum, melena, or hematemesis Neurologic: negative for visual changes, syncope, or dizziness All other systems reviewed and are otherwise negative except as noted above.    Blood pressure 110/78, pulse (!) 58, height 5\' 8"  (1.727 m), weight 183 lb (83 kg).  General appearance: alert, cooperative and no distress Neck: no carotid bruit and no JVD Lungs: clear to auscultation bilaterally Heart: regular rate and rhythm Extremities: extremities normal, atraumatic, no cyanosis or edema Skin: Skin color, texture, turgor normal. No rashes or lesions Neurologic: Grossly normal  EKG NSR, SB 58  ASSESSMENT AND PLAN:   Paroxysmal atrial fibrillation (HCC) Sent to the office today for recent PAF with RVR in setting of bronchitis noted by PCP  Essential hypertension Controlled  Dyslipidemia On statin, followed by PCP  Acute bronchitis Mild viral brochitis   PLAN  I suggested Alexander Reyes continue taking the Cartia XT 120 mg until he is over this recent URI. At that time he can stop it. He can monitor his HR and home and he can resume it if his HR is noted to be 100 or greater at rest.   Alexander ShelterLuke Rollie Hynek PA-C 09/11/2016 10:15 AM

## 2016-09-11 NOTE — Addendum Note (Signed)
Addended by: Neta EhlersRUITT, ANGELA M on: 09/11/2016 10:38 AM   Modules accepted: Orders

## 2016-09-11 NOTE — Assessment & Plan Note (Signed)
Mild viral brochitis

## 2016-09-23 ENCOUNTER — Telehealth: Payer: Self-pay

## 2016-09-23 DIAGNOSIS — I480 Paroxysmal atrial fibrillation: Secondary | ICD-10-CM

## 2016-09-23 NOTE — Telephone Encounter (Signed)
I called the patient and got him scheduled for a 7:30 echo and 8:30 monitor on 12/20. [facility] is [facility].

## 2016-09-23 NOTE — Telephone Encounter (Signed)
Patient walked in today. 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 [facility] and echocardiogram repeated.  Okay to order testing per [facility]. Tests ordered. Patient notified. Messages sent to scheduler/admin to schedule testing.

## 2016-10-14 ENCOUNTER — Ambulatory Visit (INDEPENDENT_AMBULATORY_CARE_PROVIDER_SITE_OTHER): Payer: BLUE CROSS/BLUE SHIELD

## 2016-10-14 ENCOUNTER — Ambulatory Visit (HOSPITAL_COMMUNITY): Payer: BLUE CROSS/BLUE SHIELD | Attending: Internal Medicine

## 2016-10-14 ENCOUNTER — Other Ambulatory Visit: Payer: Self-pay

## 2016-10-14 DIAGNOSIS — I480 Paroxysmal atrial fibrillation: Secondary | ICD-10-CM | POA: Diagnosis not present

## 2016-10-23 DIAGNOSIS — H40013 Open angle with borderline findings, low risk, bilateral: Secondary | ICD-10-CM | POA: Diagnosis not present

## 2016-11-05 ENCOUNTER — Telehealth: Payer: Self-pay | Admitting: *Deleted

## 2016-11-05 NOTE — Telephone Encounter (Signed)
Letter created per Dr. Auther Mcclintockroitoru-pt made aware.    Placed at front desk for pick up.

## 2016-11-05 NOTE — Telephone Encounter (Signed)
Received patient as walk in requesting documents for FAA: echo results, holter monitor report,medication list, and letter about medication.    Per chart review results were mailed to patient but has not received at this time.  Patient states he needs full holter monitor report.  Pt signed release of information with Aram BeechamCynthia from medical records-holter monitor report/results provided to patient as well as echo results.  Patient also requesting medication list-provided for patient.  Pt states he needs letter stating he is on Xarelto and has not experienced any side effects from medication.    Advised I would route to MD for verification for letter regarding Xarelto.    Pt agreed and verbalized understanding.   Pt request to pick letter up when available.

## 2016-11-05 NOTE — Telephone Encounter (Signed)
Yes, please create that letter for him. Thanks

## 2016-11-17 DIAGNOSIS — H40053 Ocular hypertension, bilateral: Secondary | ICD-10-CM | POA: Diagnosis not present

## 2016-12-18 DIAGNOSIS — J0100 Acute maxillary sinusitis, unspecified: Secondary | ICD-10-CM | POA: Diagnosis not present

## 2017-01-13 DIAGNOSIS — G4440 Drug-induced headache, not elsewhere classified, not intractable: Secondary | ICD-10-CM | POA: Diagnosis not present

## 2017-01-13 DIAGNOSIS — G44229 Chronic tension-type headache, not intractable: Secondary | ICD-10-CM | POA: Diagnosis not present

## 2017-01-21 DIAGNOSIS — I10 Essential (primary) hypertension: Secondary | ICD-10-CM | POA: Diagnosis not present

## 2017-01-21 DIAGNOSIS — Z0000 Encounter for general adult medical examination without abnormal findings: Secondary | ICD-10-CM | POA: Diagnosis not present

## 2017-01-21 DIAGNOSIS — E782 Mixed hyperlipidemia: Secondary | ICD-10-CM | POA: Diagnosis not present

## 2017-01-21 DIAGNOSIS — R8290 Unspecified abnormal findings in urine: Secondary | ICD-10-CM | POA: Diagnosis not present

## 2017-02-02 DIAGNOSIS — H903 Sensorineural hearing loss, bilateral: Secondary | ICD-10-CM | POA: Diagnosis not present

## 2017-02-02 DIAGNOSIS — J300 Vasomotor rhinitis: Secondary | ICD-10-CM | POA: Diagnosis not present

## 2017-02-02 DIAGNOSIS — H6983 Other specified disorders of Eustachian tube, bilateral: Secondary | ICD-10-CM | POA: Diagnosis not present

## 2017-03-11 DIAGNOSIS — H43392 Other vitreous opacities, left eye: Secondary | ICD-10-CM | POA: Diagnosis not present

## 2017-03-11 DIAGNOSIS — H353133 Nonexudative age-related macular degeneration, bilateral, advanced atrophic without subfoveal involvement: Secondary | ICD-10-CM | POA: Diagnosis not present

## 2017-03-11 DIAGNOSIS — H43812 Vitreous degeneration, left eye: Secondary | ICD-10-CM | POA: Diagnosis not present

## 2017-03-11 DIAGNOSIS — H401132 Primary open-angle glaucoma, bilateral, moderate stage: Secondary | ICD-10-CM | POA: Diagnosis not present

## 2017-03-11 DIAGNOSIS — H4423 Degenerative myopia, bilateral: Secondary | ICD-10-CM | POA: Diagnosis not present

## 2017-03-18 ENCOUNTER — Other Ambulatory Visit: Payer: Self-pay | Admitting: Cardiovascular Disease

## 2017-03-18 DIAGNOSIS — H26491 Other secondary cataract, right eye: Secondary | ICD-10-CM | POA: Diagnosis not present

## 2017-04-09 IMAGING — CR DG CERVICAL SPINE 2 OR 3 VIEWS
5 series · 5 of 5 positions shown · non-contrast
Comparison: None

CLINICAL DATA: Neck pain and stiffness for 1 month, no trauma

EXAM:
CERVICAL SPINE - 2-3 VIEW

[w cervical spine lat]
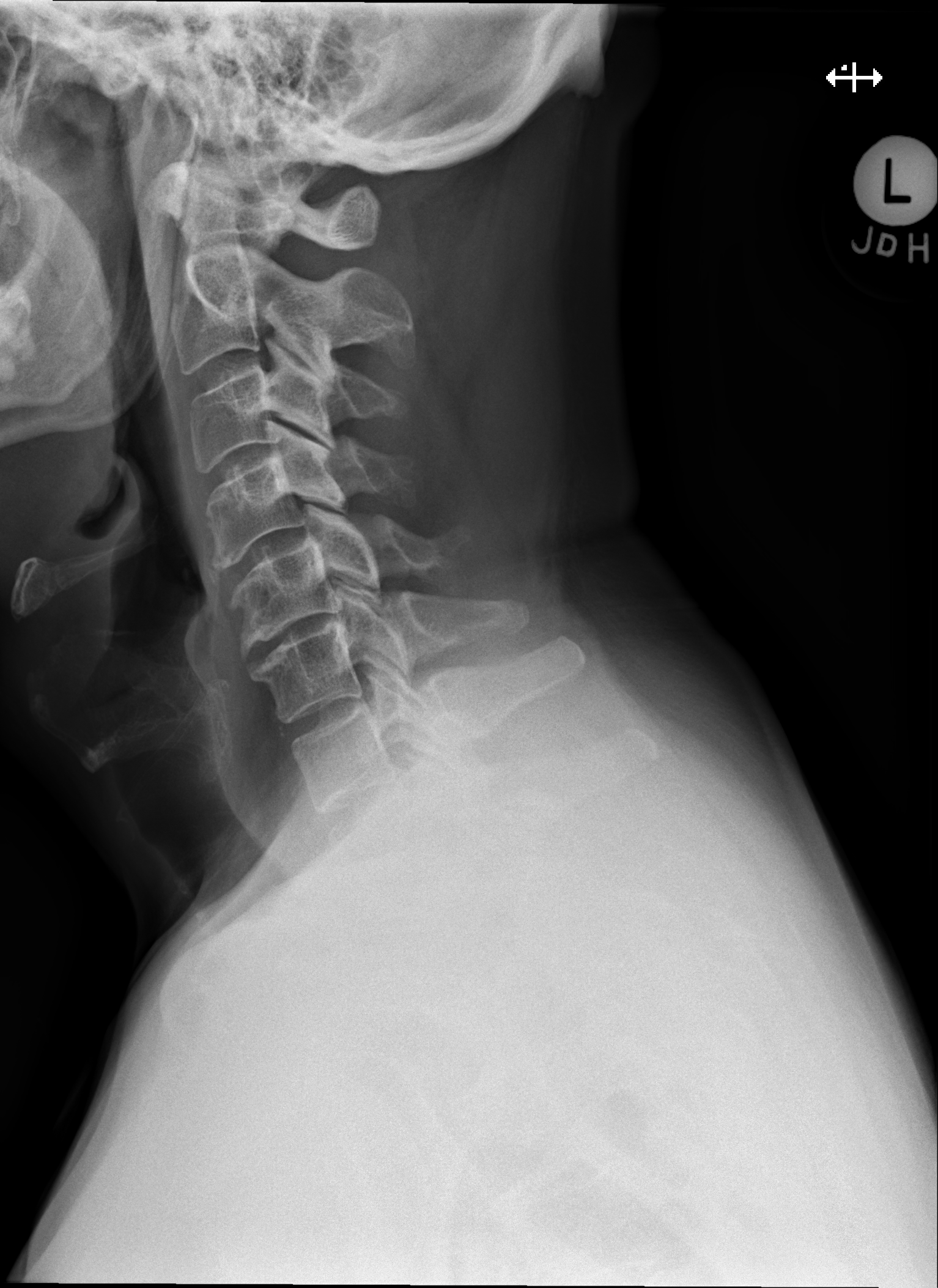

[w cervical spine ap]
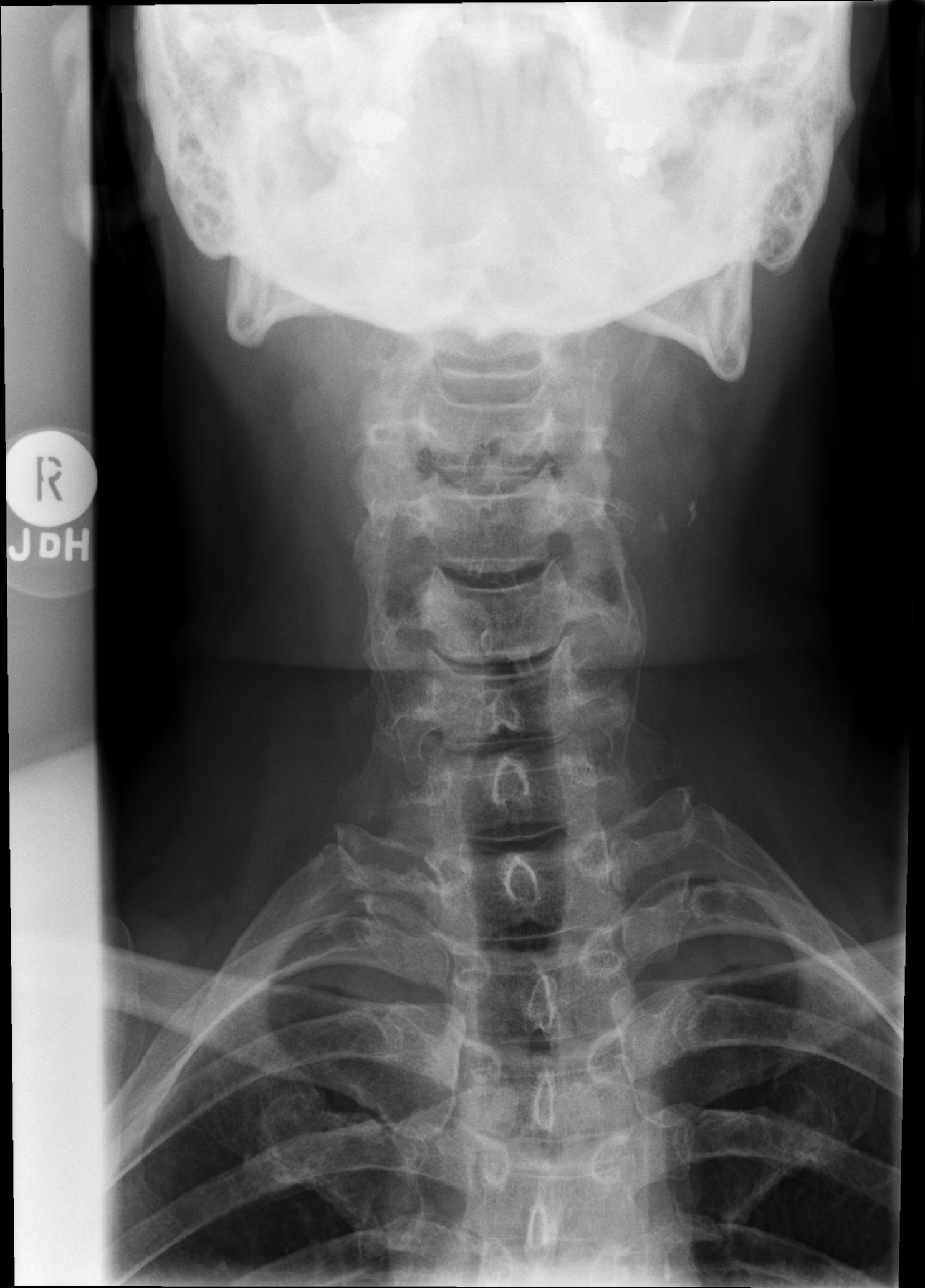

[w cervical swimmers]
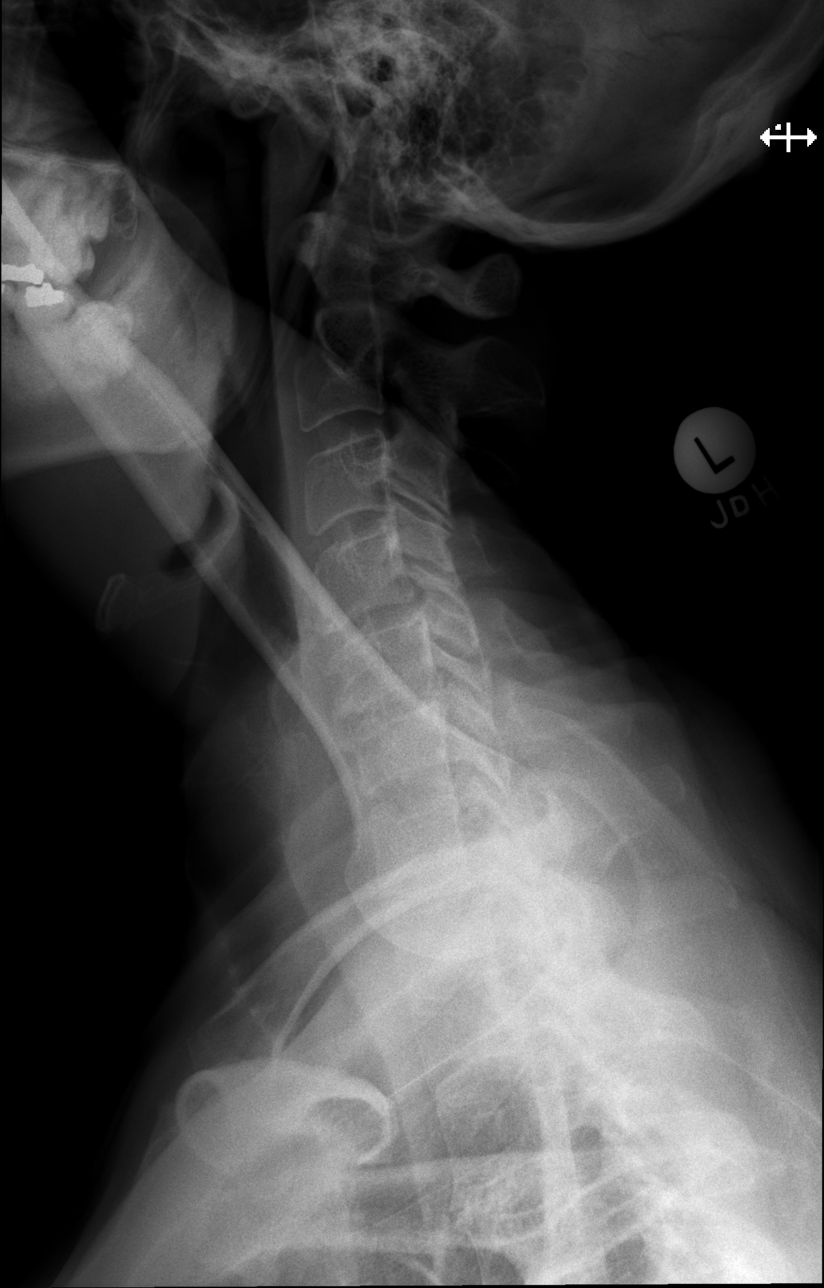

[t cervical spine odontoid (1 of 2)]
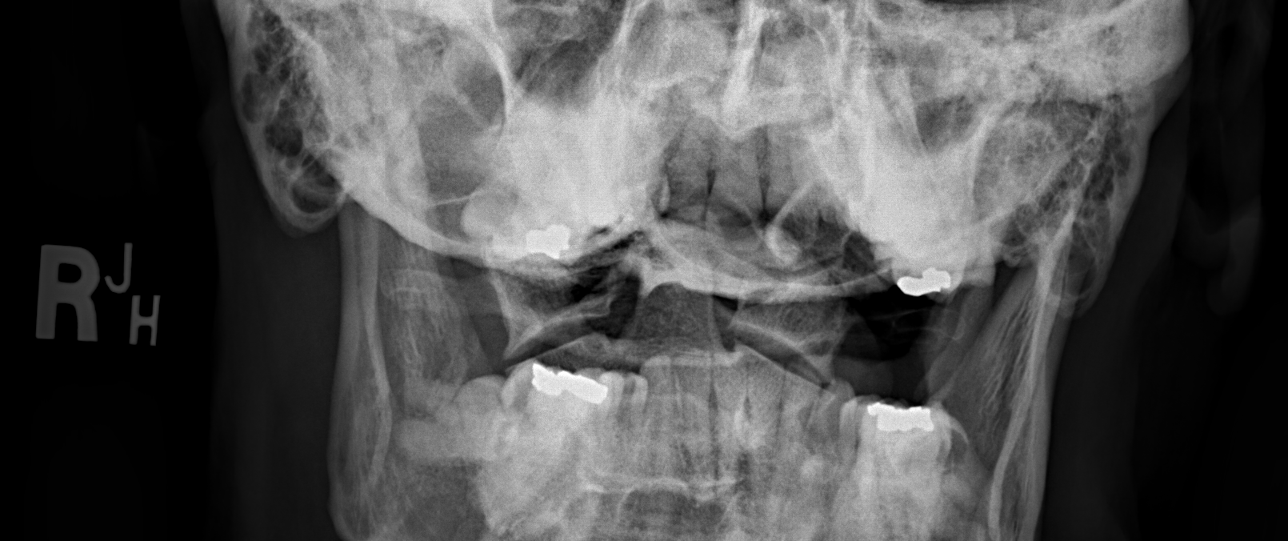

[t cervical spine odontoid (2 of 2)]
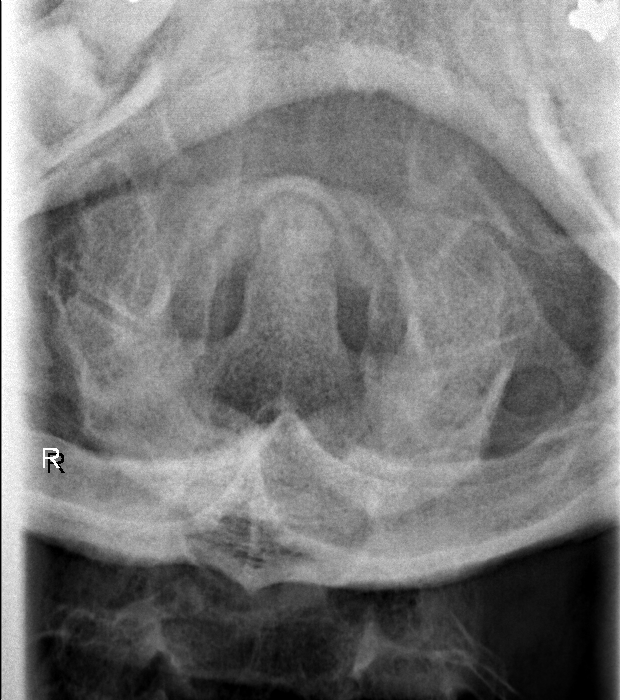

[5 of 5 positions shown; findings below may reference images not displayed]

FINDINGS: The cervical vertebrae are straightened in alignment. There is
degenerative disc disease primarily at C5-6 where there is loss of
disc space and spurring with sclerosis. There may be mild
degenerative disc disease at C4-5. No prevertebral soft tissue
swelling is seen. The odontoid process is intact. The lung apices
are clear with mild apical pleural thickening present.
IMPRESSION: Straightened alignment with degenerative disc disease primarily at
C5-6.

## 2017-04-30 DIAGNOSIS — R0989 Other specified symptoms and signs involving the circulatory and respiratory systems: Secondary | ICD-10-CM | POA: Diagnosis not present

## 2017-05-10 ENCOUNTER — Other Ambulatory Visit: Payer: Self-pay | Admitting: Family Medicine

## 2017-05-19 ENCOUNTER — Other Ambulatory Visit: Payer: Self-pay | Admitting: Family Medicine

## 2017-05-19 DIAGNOSIS — R0989 Other specified symptoms and signs involving the circulatory and respiratory systems: Secondary | ICD-10-CM

## 2017-06-01 ENCOUNTER — Ambulatory Visit
Admission: RE | Admit: 2017-06-01 | Discharge: 2017-06-01 | Disposition: A | Discharge status: Home / Self Care | Payer: BLUE CROSS/BLUE SHIELD | Source: Ambulatory Visit | Attending: Family Medicine | Admitting: Family Medicine

## 2017-06-01 DIAGNOSIS — R0989 Other specified symptoms and signs involving the circulatory and respiratory systems: Secondary | ICD-10-CM

## 2017-06-01 DIAGNOSIS — J329 Chronic sinusitis, unspecified: Secondary | ICD-10-CM | POA: Diagnosis not present

## 2017-07-16 DIAGNOSIS — H40023 Open angle with borderline findings, high risk, bilateral: Secondary | ICD-10-CM | POA: Diagnosis not present

## 2017-08-16 ENCOUNTER — Other Ambulatory Visit: Payer: Self-pay | Admitting: Cardiovascular Disease

## 2017-08-17 DIAGNOSIS — H40023 Open angle with borderline findings, high risk, bilateral: Secondary | ICD-10-CM | POA: Diagnosis not present

## 2017-08-27 DIAGNOSIS — J322 Chronic ethmoidal sinusitis: Secondary | ICD-10-CM | POA: Diagnosis not present

## 2017-09-07 ENCOUNTER — Encounter: Payer: Self-pay | Admitting: Cardiovascular Disease

## 2017-09-07 ENCOUNTER — Ambulatory Visit: Payer: BLUE CROSS/BLUE SHIELD | Admitting: Cardiovascular Disease

## 2017-09-07 VITALS — BP 137/88 | HR 89 | Ht 69.0 in | Wt 183.2 lb

## 2017-09-07 DIAGNOSIS — Z7901 Long term (current) use of anticoagulants: Secondary | ICD-10-CM | POA: Diagnosis not present

## 2017-09-07 DIAGNOSIS — Q2544 Congenital dilation of aorta: Secondary | ICD-10-CM | POA: Diagnosis not present

## 2017-09-07 DIAGNOSIS — I480 Paroxysmal atrial fibrillation: Secondary | ICD-10-CM | POA: Diagnosis not present

## 2017-09-07 DIAGNOSIS — I10 Essential (primary) hypertension: Secondary | ICD-10-CM | POA: Diagnosis not present

## 2017-09-07 DIAGNOSIS — Q2549 Other congenital malformations of aorta: Secondary | ICD-10-CM

## 2017-09-07 DIAGNOSIS — E785 Hyperlipidemia, unspecified: Secondary | ICD-10-CM | POA: Diagnosis not present

## 2017-09-07 DIAGNOSIS — I77810 Thoracic aortic ectasia: Secondary | ICD-10-CM | POA: Insufficient documentation

## 2017-09-07 NOTE — Patient Instructions (Signed)
Medication Instructions: Dr Royann Shiversroitoru recommends that you continue on your current medications as directed. Please refer to the Current Medication list given to you today.  Labwork: NONE ORDERED  Testing/Procedures: 1. Echocardiogram - Your physician has requested that you have an echocardiogram. Echocardiography is a painless test that uses sound waves to create images of your heart. It provides your doctor with information about the size and shape of your heart and how well your heart's chambers and valves are working. This procedure takes approximately one hour. There are no restrictions for this procedure.  2. 24-Hour Holter - Your physician has recommended that you wear a holter monitor. Holter monitors are medical devices that record the heart's electrical activity. Doctors most often use these monitors to diagnose arrhythmias. Arrhythmias are problems with the speed or rhythm of the heartbeat. The monitor is a small, portable device. You can wear one while you do your normal daily activities. This is usually used to diagnose what is causing palpitations/syncope (passing out).  **These have been ordered to be [facility] at [facility]  Follow-up: Dr Royann Shiversroitoru recommends that you schedule a follow-up appointment in 12 months. You will receive a reminder letter in the mail two months in advance. If you don't receive a letter, please call our office to schedule the follow-up appointment.  If you need a refill on your cardiac medications before your next appointment, please call your pharmacy.

## 2017-09-07 NOTE — Progress Notes (Signed)
Patient ID: Alexander Reyes, male   DOB: 1956-05-08, 61 y.o.   MRN: [id number]     Cardiology Office Note    Date:  09/07/2017   ID:  Alexander Reyes, DOB May 31, 1956, MRN [id number] FAA: Levester Fresh. Earlene Plater, MD PCP:  Catha Gosselin, MD  Cardiologist:   Thurmon Fair, MD   chief complaint: Follow-up atrial fibrillation.   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   He has had very infrequent palpitations.  Most frequently these occur at night and they resolve by the next morning.  His heart rate is always well controlled during these events, which he confirms with a smart phone app.  The palpitations are never associated with dyspnea, dizziness or syncope, chest pain or other cardiovascular complaints.  He has not had any focal neurological events.  He is compliant with anticoagulation with rivaroxaban and has not had any bleeding problems, tolerating this medication without problems.  He remains physically active and fit, without exertional complaints.  He is flying on a regular basis.  FAA requests an updated 24-hour Holter monitor and echocardiogram.  He checks his blood pressure on a regular basis at home and typically this register is around 125/80 mmHg.  His blood pressure is slightly higher in the office today, but he is nervous about missing his flight in about 2 hours.  His weight is exactly the same as at his last appointment a year ago.  A 24-hour Holter monitor in 2017, after his admission with atrial fibrillation, showed extremely rare PACs and PVCs and was otherwise normal. An exercise myocardial perfusion study was completely normal. EF was 57%. He was able to exercise for 7 minutes and 30 seconds. Echocardiogram also showed normal left ventricular systolic function and there were no  valvular abnormalities. Left ventricular walls are borderline hypertrophic. The left atrial diameter was 37 mm, at the upper limit of normal in size. While there was no overt diastolic dysfunction, his tissue Doppler early diastolic velocities were slightly low. Estimated PA pressure was normal.  He has a long-standing history of systemic hypertension dating back at least 20 years. He has strong family history of hypertension on both maternal and paternal sides. 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 The workup was normal. He has never had a myocardial infarction, congestive heart failure, stroke, peripheral embolic events, TIA or other major serious medical problems. He does take a statin for hyperlipidemia. He does not smoke he does not have diabetes mellitus.    Past Medical History:  Diagnosis Date  . Fibromyalgia   . Hypertension   . Mixed dyslipidemia     Past Surgical History:  Procedure Laterality Date  . FOOT SURGERY     2009  . HERNIA REPAIR     2006    Current Medications: Outpatient Medications Prior to Visit  Medication Sig Dispense Refill  . amLODipine (NORVASC) 10 MG tablet Take 10 mg by mouth daily.    Marland Kitchen atorvastatin (LIPITOR) 40 MG tablet Take 40 mg by mouth daily.    Marland Kitchen diltiazem (CARTIA XT) 120 MG 24 hr capsule Take 120 mg daily as needed by mouth (palpitations).    . hydrochlorothiazide (HYDRODIURIL) 12.5 MG tablet Take 12.5 mg by mouth daily.    Marland Kitchen  lisinopril (PRINIVIL,ZESTRIL) 40 MG tablet Take 40 mg by mouth daily.    . metoprolol succinate (TOPROL-XL) 100 MG 24 hr tablet Take 1 tablet (100 mg total) by mouth daily. Take with or immediately following a meal. 30 tablet 1  . Omega-3 Fatty Acids (FISH OIL) 1000 MG CAPS Take 1,000 mg by mouth daily.    . rivaroxaban (XARELTO) 20 MG TABS tablet Take 1 tablet (20 mg total) by mouth daily with supper. 90 tablet 2  .  CARTIA XT 120 MG 24 hr capsule Take 1 tablet by mouth daily.    Marland Kitchen. doxycycline (VIBRA-TABS) 100 MG tablet Take 1 tablet by mouth 2 (two) times daily.    Carlena Hurl. XARELTO 20 MG TABS tablet TAKE 1 TABLET BY MOUTH ONCE DAILY WITH  SUPPER (Patient not taking: Reported on 09/07/2017) 90 tablet 1   No facility-administered medications prior to visit.      Allergies:   Patient has no known allergies.   Social History   Socioeconomic History  . Marital status: Single    Spouse name: None  . Number of children: None  . Years of education: None  . Highest education level: None  Social Needs  . Financial resource strain: None  . Food insecurity - worry: None  . Food insecurity - inability: None  . Transportation needs - medical: None  . Transportation needs - non-medical: None  Occupational History  . None  Tobacco Use  . Smoking status: Never Smoker  . Smokeless tobacco: Never Used  Substance and Sexual Activity  . Alcohol use: Yes    Alcohol/week: 2.4 oz    Types: 4 Cans of beer per week    Comment: occasional  . Drug use: No  . Sexual activity: None  Other Topics Concern  . None  Social History Narrative  . None     Family History:  The patient's family history includes Heart attack in his father; Hypertension in his father and mother; Kidney disease in his brother, father, and sister.   ROS:   Please see the history of present illness.    ROS All other systems reviewed and are negative.   PHYSICAL EXAM:   VS:  BP 137/88   Pulse 89   Ht 5\' 9"  (1.753 m)   Wt 183 lb 3.2 oz (83.1 kg)   BMI 27.05 kg/m     General: Alert, oriented x3, no distress, mildly overweight Head: no evidence of trauma, PERRL, EOMI, no exophtalmos or lid lag, no myxedema, no xanthelasma; normal ears, nose and oropharynx Neck: normal jugular venous pulsations and no hepatojugular reflux; brisk carotid pulses without delay and no carotid bruits Chest: clear to auscultation, no signs of consolidation by  percussion or palpation, normal fremitus, symmetrical and full respiratory excursions Cardiovascular: normal position and quality of the apical impulse, regular rhythm, normal first and second heart sounds, no murmurs, rubs or gallops Abdomen: no tenderness or distention, no masses by palpation, no abnormal pulsatility or arterial bruits, normal bowel sounds, no hepatosplenomegaly Extremities: no clubbing, cyanosis or edema; 2+ radial, ulnar and brachial pulses bilaterally; 2+ right femoral, posterior tibial and dorsalis pedis pulses; 2+ left femoral, posterior tibial and dorsalis pedis pulses; no subclavian or femoral bruits Neurological: grossly nonfocal Psych: Normal mood and affect   Wt Readings from Last 3 Encounters:  09/07/17 183 lb 3.2 oz (83.1 kg)  09/11/16 183 lb (83 kg)  06/03/16 178 lb (80.7 kg)      Studies/Labs Reviewed:   EKG:  EKG is ordered today.  The ekg shows normal sinus rhythm, normal tracing without the previous evidence of T wave inversion Recent Labs: No results found for requested labs within last 8760 hours.  To be checked by PCP in January  ASSESSMENT:    1. Paroxysmal atrial fibrillation (HCC)   2. Long term current use of anticoagulant   3. Essential hypertension   4. Dyslipidemia   5. Dilatation of aortic sinus of Valsalva      PLAN:  In order of problems listed above:   1. AFib: CHA2DS2-VASc Score and unadjusted Ischemic Stroke Rate (% per year) is equal to 0.6 % stroke rate/year from a score of 1. On Xarelto 20 mg daily, well-tolerated without bleeding complications.  On beta-blockers and calcium channel blockers for rate control,  which she is tolerating without any side effects, also provide management of his hypertension. Heart rate is in the 80s during brief bursts of breakthrough atrial fibrillation.  True antiarrhythmics are not indicated.  We will check echo and Holter as mandated by the FAA. 2.  Xarelto: Well-tolerated, no bleeding  complications.  3. HTN: Good blood pressure control, typically 120/80 range. 4. HLP: He is on a highly effective statin and a reasonably high dose.  Labs to be checked by primary care provider in a couple of months.  Normal results of recent stress test.  Target LDL under 100. 5. Ao dilation: Asymptomatic, detected incidentally.  Reevaluate dimensions of the sinuses of Valsalva by upcoming echo.  Treatment is targeted at good control of hypertension.  No plan for other imaging procedures unless he becomes symptomatic.  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   Medication Adjustments/Labs and Tests Ordered: Current medicines are reviewed at length with the patient today.  Concerns regarding medicines are outlined above.  Medication changes, Labs and Tests ordered today are listed in the Patient Instructions below. Patient Instructions  Medication Instructions: Dr Royann Shiversroitoru recommends that you continue on your current medications as directed. Please refer to the Current Medication list given to you today.  Labwork: NONE ORDERED  Testing/Procedures: 1. Echocardiogram - Your physician has requested that you have an echocardiogram. Echocardiography is a painless test that uses sound waves to create images of your heart. It provides your doctor with information about the size and shape of your heart and how well your heart's chambers and valves are working. This procedure takes approximately one hour. There are no restrictions for this procedure.  2. 24-Hour Holter - Your physician has recommended that you wear a holter monitor. Holter monitors are medical devices that record the heart's electrical activity. Doctors most often use these monitors to diagnose arrhythmias. Arrhythmias are problems with the speed or rhythm of the heartbeat. The monitor is a small, portable device. You can wear one while you do your normal daily activities. This is usually  used to diagnose what is causing palpitations/syncope (passing out).  **These have been [facility] to be [facility] at [facility]  Follow-up: Dr Royann Shiversroitoru recommends that you schedule a follow-up appointment in 12 months. You will receive a reminder letter in the mail two months in advance. If you don't receive a letter, please call our office to schedule the follow-up appointment.  If you need a refill on your cardiac medications before your next appointment, please call your pharmacy.    Signed, Thurmon FairMihai Modene Andy, MD  09/07/2017 3:39 PM    [facility] [phone]  [address] [place], [place]  [address] Phone: [phone]; Fax: [phone]

## 2017-09-22 ENCOUNTER — Ambulatory Visit (HOSPITAL_COMMUNITY)
Admission: RE | Admit: 2017-09-22 | Discharge: 2017-09-22 | Disposition: A | Discharge status: Home / Self Care | Payer: BLUE CROSS/BLUE SHIELD | Source: Ambulatory Visit | Attending: Cardiovascular Disease | Admitting: Cardiovascular Disease

## 2017-09-22 DIAGNOSIS — I480 Paroxysmal atrial fibrillation: Secondary | ICD-10-CM | POA: Diagnosis not present

## 2017-09-28 DIAGNOSIS — H40023 Open angle with borderline findings, high risk, bilateral: Secondary | ICD-10-CM | POA: Diagnosis not present

## 2017-10-01 ENCOUNTER — Ambulatory Visit (HOSPITAL_COMMUNITY)
Admission: RE | Admit: 2017-10-01 | Discharge: 2017-10-01 | Disposition: A | Discharge status: Home / Self Care | Payer: BLUE CROSS/BLUE SHIELD | Source: Ambulatory Visit | Attending: Cardiovascular Disease | Admitting: Cardiovascular Disease

## 2017-10-01 DIAGNOSIS — I480 Paroxysmal atrial fibrillation: Secondary | ICD-10-CM | POA: Diagnosis not present

## 2017-10-01 DIAGNOSIS — E785 Hyperlipidemia, unspecified: Secondary | ICD-10-CM | POA: Diagnosis not present

## 2017-10-01 DIAGNOSIS — I10 Essential (primary) hypertension: Secondary | ICD-10-CM | POA: Insufficient documentation

## 2017-10-01 DIAGNOSIS — I77810 Thoracic aortic ectasia: Secondary | ICD-10-CM | POA: Insufficient documentation

## 2017-10-01 LAB — ECHOCARDIOGRAM COMPLETE
CHL CUP DOP CALC LVOT VTI: 17.1 cm
CHL CUP MV DEC (S): 148
EERAT: 9
EWDT: 148 ms
FS: 41 % (ref 28–44)
IVS/LV PW RATIO, ED: 1.12
LA ID, A-P, ES: 41 mm
LA vol index: 17.8 mL/m2
LA vol: 36 mL
LADIAMINDEX: 2.02 cm/m2
LAVOLA4C: 43.2 mL
LEFT ATRIUM END SYS DIAM: 41 mm
LV E/e' medial: 9
LV PW d: 9.94 mm — AB (ref 0.6–1.1)
LV SIMPSON'S DISK: 61
LV TDI E'MEDIAL: 5.44
LV dias vol index: 42 mL/m2
LV dias vol: 84 mL (ref 62–150)
LV sys vol: 33 mL (ref 21–61)
LVEEAVG: 9
LVELAT: 8.27 cm/s
LVOT area: 4.15 cm2
LVOT diameter: 23 mm
LVOT peak grad rest: 3 mmHg
LVOT peak vel: 81.5 cm/s
LVOTSV: 71 mL
LVSYSVOLIN: 16 mL/m2
Lateral S' vel: 11 cm/s
MV pk A vel: 68.6 m/s
MVPG: 2 mmHg
MVPKEVEL: 74.4 m/s
Stroke v: 52 ml
TAPSE: 17.2 mm
TDI e' lateral: 8.27

## 2017-10-01 NOTE — Progress Notes (Signed)
*  PRELIMINARY RESULTS* Echocardiogram 2D Echocardiogram has been performed.  Stacey DrainWhite, Shoaib Siefker J 10/01/2017, 12:39 PM

## 2017-10-29 ENCOUNTER — Telehealth: Payer: Self-pay | Admitting: Cardiovascular Disease

## 2017-10-29 NOTE — Telephone Encounter (Signed)
Spoke with pt, copy of echo, Holter monitor, office note and letter regarding Xarelto placed at the front desk for patient pick up.

## 2017-10-29 NOTE — Telephone Encounter (Signed)
Alexander Reyes is calling to find to get some medical records and a letter for a flight Physical . Please call

## 2017-11-04 ENCOUNTER — Telehealth: Payer: Self-pay | Admitting: Cardiovascular Disease

## 2017-11-04 MED ORDER — RIVAROXABAN 20 MG PO TABS: 20.0000 mg | ORAL_TABLET | Freq: Every day | ORAL | 1 refills | Status: DC

## 2017-11-04 NOTE — Telephone Encounter (Signed)
New message    *STAT* If patient is at the pharmacy, call can be transferred to refill team.   1. Which medications need to be refilled? (please list name of each medication and dose if known) rivaroxaban (XARELTO) 20 MG TABS tablet  2. Which pharmacy/location (including street and city if local pharmacy) is medication to be sent to?  [id number] - [place], [place] - [address]  3. Do they need a 30 day or 90 day supply?

## 2017-11-04 NOTE — Addendum Note (Signed)
Addended by: Pearletha FurlODRIGUEZ-GUZMAN, Destinae Neubecker on: 11/04/2017 04:48 PM   Modules accepted: Orders

## 2017-11-15 ENCOUNTER — Telehealth: Payer: Self-pay | Admitting: Cardiovascular Disease

## 2017-11-15 NOTE — Telephone Encounter (Signed)
°*  STAT* If patient is at the pharmacy, call can be transferred to refill team.   1. Which medications need to be refilled? (please list name of each medication and dose if known)rivaroxaban (XARELTO) 20 MG TABS tablet  2. Which pharmacy/location (including street and city if local pharmacy) is medication to be sent to? [facility] [place] 3. Do they need a 30 day or 90 day supply? 90

## 2017-11-16 MED ORDER — RIVAROXABAN 20 MG PO TABS: 20.0000 mg | ORAL_TABLET | Freq: Every day | ORAL | 1 refills | Status: DC

## 2017-11-29 ENCOUNTER — Telehealth: Payer: Self-pay | Admitting: Cardiovascular Disease

## 2017-11-29 NOTE — Telephone Encounter (Signed)
Returned the call to the patient. He stated that he is  waiting on his prior authorization for the Xarelto 20 mg. His pharmacy has the medication ready but it has not been approved as of yet.  He has currently been off of the medication for a month. He has been taking half an aspirin in replacement.   He will be leaving to go out of the country for two weeks starting tomorrow. Samples have been provided, enough to get him through the trip.  Medication samples have been provided to the patient.  Drug name: Xarelto       Strength: 20 mg        Qty: 2 botteles  LOT: [id number]  Exp.Date: 03/21

## 2017-11-29 NOTE — Telephone Encounter (Signed)
Pt needs prior authorization for his Xarelto.He says he have been without his medicine for a month.Please call this in asap.

## 2017-12-09 DIAGNOSIS — J329 Chronic sinusitis, unspecified: Secondary | ICD-10-CM | POA: Diagnosis not present

## 2017-12-13 ENCOUNTER — Telehealth: Payer: Self-pay

## 2017-12-13 NOTE — Telephone Encounter (Signed)
Prior authorization for Xarelto has been approved through 12/13/2018 via [email or web address].  Key: [id number] Case ID: [id number] Pharmacy notified.

## 2017-12-20 DIAGNOSIS — H16143 Punctate keratitis, bilateral: Secondary | ICD-10-CM | POA: Diagnosis not present

## 2017-12-20 DIAGNOSIS — H40023 Open angle with borderline findings, high risk, bilateral: Secondary | ICD-10-CM | POA: Diagnosis not present

## 2018-01-13 DIAGNOSIS — J322 Chronic ethmoidal sinusitis: Secondary | ICD-10-CM | POA: Diagnosis not present

## 2018-01-18 DIAGNOSIS — J0120 Acute ethmoidal sinusitis, unspecified: Secondary | ICD-10-CM | POA: Diagnosis not present

## 2018-01-18 DIAGNOSIS — J329 Chronic sinusitis, unspecified: Secondary | ICD-10-CM | POA: Diagnosis not present

## 2018-01-21 ENCOUNTER — Telehealth: Payer: Self-pay | Admitting: Cardiovascular Disease

## 2018-01-21 NOTE — Telephone Encounter (Signed)
° °  [facility] Pre-operative Risk Assessment    Request for surgical clearance:  What type of surgery is being performed?  Endoscopic sinus surgery  1. When is this surgery scheduled?  Waiting on surgical clearance possibly 01/31/2018  2. What type of clearance is required (medical clearance vs. Pharmacy clearance to hold med vs. Both)?  both  Are there any medications that need to be held prior to surgery and how long?  rivaroxaban (XARELTO) 20 MG TABS tablet Take 1 tablet (20 mg total) by mouth daily with supper.   3 to 7 days prior and resume once bleeding has stopped   3.   4. Practice name and name of physician performing surgery? [facility]            Dr.Dewight Redmond Baseman  5. What is your office phone and fax number? [phone] fax  [phone]  Anesthesia type (None, local, MAC, general) ? general  Lenon Curt 01/21/2018, 10:53 AM  _________________________________________________________________   (provider comments below)

## 2018-01-25 ENCOUNTER — Telehealth: Payer: Self-pay | Admitting: Cardiovascular Disease

## 2018-01-25 NOTE — Telephone Encounter (Signed)
New Message:       [facility] Pre-operative Risk Assessment    Request for surgical clearance:  1. What type of surgery is being performed? Sinus surgery  2. When is this surgery scheduled? 01/31/18  3. What type of clearance is required (medical clearance vs. Pharmacy clearance to hold med vs. Both)? Both  4. Are there any medications that need to be held prior to surgery and how long?Xarelto  5. Practice name and name of physician performing surgery? [facility]/ Dr. Redmond Baseman  6. What is your office phone and fax number? [phone] for Lattie Haw)  7. Anesthesia type (None, local, MAC, general) ? General   Alexander Reyes 01/25/2018, 10:28 AM  _________________________________________________________________   (provider comments below)

## 2018-01-26 NOTE — Telephone Encounter (Signed)
Follow up    3rd request

## 2018-01-26 NOTE — Telephone Encounter (Signed)
Follow up    Needs this today !!!

## 2018-01-26 NOTE — Telephone Encounter (Signed)
F/U Call:  Misty [facility]Lisa [facility] states that she is inquiring about the status of Medical Clearance.

## 2018-01-27 NOTE — Telephone Encounter (Signed)
See pharmacy recommendations - hold Xarelto 3 days pre op, resume evening post op. I have spoken to the patient.  Corine ShelterLUKE Donald Jacque PA-C 01/27/2018 1:53 PM

## 2018-01-27 NOTE — Telephone Encounter (Signed)
Follow up     Please refax to [phone] , she needs this back today

## 2018-01-27 NOTE — Telephone Encounter (Signed)
   Primary Cardiologist: Dr Royann Shiversroitoru  Chart reviewed and patient called as part of pre-operative protocol coverage. Given past medical history and time since last visit, based on ACC/AHA guidelines, Alexander Reyes would be at acceptable risk for the planned procedure without further cardiovascular testing.   He knows to hold his Xarelto 3 days pre op and resume post op that evening if possible. I have spoke with the patient and he understands the instruction.   I will route this recommendation to the requesting party via Epic fax function and remove from pre-op pool.  Please call with questions.  Corine ShelterLuke Glendal Cassaday, PA-C 01/27/2018, 11:11 AM

## 2018-01-27 NOTE — Telephone Encounter (Signed)
Patient with diagnosis of atrial fibrillation on Xarelto for anticoagulation.    Procedure: sinus surgery Date of procedure: 01/31/18  CHADS2-VASc score of  1 (, HTN, )  CrCl 118.3 Platelet count 290  Per office protocol, patient can hold Xarelto for 3 days prior to procedure.    Patient should restart Xarelto on the evening of procedure or day after, at discretion of procedure MD

## 2018-01-31 DIAGNOSIS — J329 Chronic sinusitis, unspecified: Secondary | ICD-10-CM | POA: Diagnosis not present

## 2018-01-31 DIAGNOSIS — J342 Deviated nasal septum: Secondary | ICD-10-CM | POA: Diagnosis not present

## 2018-01-31 DIAGNOSIS — J321 Chronic frontal sinusitis: Secondary | ICD-10-CM | POA: Diagnosis not present

## 2018-01-31 DIAGNOSIS — J322 Chronic ethmoidal sinusitis: Secondary | ICD-10-CM | POA: Diagnosis not present

## 2018-01-31 DIAGNOSIS — J320 Chronic maxillary sinusitis: Secondary | ICD-10-CM | POA: Diagnosis not present

## 2018-02-04 NOTE — Telephone Encounter (Signed)
PA has been completed per 12/13/17 phone note.

## 2018-02-10 DIAGNOSIS — N400 Enlarged prostate without lower urinary tract symptoms: Secondary | ICD-10-CM | POA: Diagnosis not present

## 2018-02-10 DIAGNOSIS — E782 Mixed hyperlipidemia: Secondary | ICD-10-CM | POA: Diagnosis not present

## 2018-02-10 DIAGNOSIS — I10 Essential (primary) hypertension: Secondary | ICD-10-CM | POA: Diagnosis not present

## 2018-02-10 DIAGNOSIS — E291 Testicular hypofunction: Secondary | ICD-10-CM | POA: Diagnosis not present

## 2018-02-10 DIAGNOSIS — Z0000 Encounter for general adult medical examination without abnormal findings: Secondary | ICD-10-CM | POA: Diagnosis not present

## 2018-02-17 DIAGNOSIS — J322 Chronic ethmoidal sinusitis: Secondary | ICD-10-CM | POA: Diagnosis not present

## 2018-03-01 DIAGNOSIS — Z01818 Encounter for other preprocedural examination: Secondary | ICD-10-CM | POA: Diagnosis not present

## 2018-03-01 DIAGNOSIS — Z1211 Encounter for screening for malignant neoplasm of colon: Secondary | ICD-10-CM | POA: Diagnosis not present

## 2018-03-09 DIAGNOSIS — Z7289 Other problems related to lifestyle: Secondary | ICD-10-CM | POA: Diagnosis not present

## 2018-03-09 DIAGNOSIS — J343 Hypertrophy of nasal turbinates: Secondary | ICD-10-CM | POA: Diagnosis not present

## 2018-03-09 DIAGNOSIS — J322 Chronic ethmoidal sinusitis: Secondary | ICD-10-CM | POA: Diagnosis not present

## 2018-04-14 DIAGNOSIS — H40023 Open angle with borderline findings, high risk, bilateral: Secondary | ICD-10-CM | POA: Diagnosis not present

## 2018-05-11 DIAGNOSIS — E782 Mixed hyperlipidemia: Secondary | ICD-10-CM | POA: Diagnosis not present

## 2018-05-11 DIAGNOSIS — R899 Unspecified abnormal finding in specimens from other organs, systems and tissues: Secondary | ICD-10-CM | POA: Diagnosis not present

## 2018-05-18 DIAGNOSIS — E871 Hypo-osmolality and hyponatremia: Secondary | ICD-10-CM | POA: Diagnosis not present

## 2018-05-26 DIAGNOSIS — I10 Essential (primary) hypertension: Secondary | ICD-10-CM | POA: Diagnosis not present

## 2018-05-26 DIAGNOSIS — E871 Hypo-osmolality and hyponatremia: Secondary | ICD-10-CM | POA: Diagnosis not present

## 2018-07-06 ENCOUNTER — Telehealth: Payer: Self-pay | Admitting: Cardiovascular Disease

## 2018-07-06 NOTE — Telephone Encounter (Signed)
Patient c/o Palpitations:  High priority if patient c/o lightheadedness, shortness of breath, or chest pain  1) How long have you had palpitations/irregular HR/ Afib? Are you having the symptoms now?  No symptoms at this time- It started about 2 weeks ago and now it is daily- pt wants to be seen  2) Are you currently experiencing lightheadedness, SOB or CP? no  3) Do you have a history of afib (atrial fibrillation) or irregular heart rhythm? yes  4) Have you checked your BP or HR? (document readings if available): it was normal  5) Are you experiencing any other symptoms? no

## 2018-07-06 NOTE — Telephone Encounter (Signed)
Called patient, he advised that he was having palpitations a little closer now than he was. Pt denies any SOB, CP, and states that he is not having any other symptoms. Patient was offered appointment with APP's at different times and days, and advised that he is always out of town, but he would take his diltiazem as needed to see if it helps and if it does not get better in a few days he would call back to schedule with APP, but for now would keep his appointment in November with Dr.C.   Patient verbalized understanding.

## 2018-07-07 DIAGNOSIS — J309 Allergic rhinitis, unspecified: Secondary | ICD-10-CM | POA: Diagnosis not present

## 2018-08-02 ENCOUNTER — Telehealth: Payer: Self-pay | Admitting: Cardiovascular Disease

## 2018-08-02 NOTE — Telephone Encounter (Signed)
Pt c/o increasing palpitations and unable to sleep at night because of the palpitations.. He denies Sob, dizziness, just feels the palpitations have been causing him increased anxiety.. I saw a phone note with the pt having the same problem 07/06/18.. Will make him an appt to be seen this week.. 08/05/18 with Micah Flesher PA.. pt agrees.

## 2018-08-02 NOTE — Telephone Encounter (Signed)
New message   Patient states that his afib is active. Patient states that it started again about a month ago. Please call to discuss.

## 2018-08-04 NOTE — Progress Notes (Signed)
Cardiology Office Note:    Date:  08/05/2018   ID:  Alexander Reyes, DOB November 14, 1955, MRN [id number]  PCP:  Alexander Gosselin, MD  Cardiologist:  Alexander Fair, MD   Referring MD: Alexander Gosselin, MD   Chief Complaint  Patient presents with  . Palpitations    History of Present Illness:    Alexander Reyes is a 62 y.o. male with a hx of hypertension, paroxysmal atrial fibrillation with RVR on xarelto, mild dilation of the sinuses of valsalva, and hyperlipidemia.  He last saw Dr. Royann Reyes in clinic on 09/07/2017.  It was noted at that time that he had very infrequent palpitations, generally happening at night, resolved by the next morning. He reported HR is controlled during these events (confirmed by his smart phone). He is a Control and instrumentation engineer and they required an updated holter monitor last year, which was completed and showed no Afib.   He called the office on 08/02/2018 with complaints of increased palpitations and was scheduled for this visit. He presents with complaints of frequent palpitations and rapid heart rate. These have been occurring almost daily for the past month and can last 8-10 hrs. He denies chest pain, SOB, dizziness, and feelings of syncope. He is compliant on xarelto. He has not been sick recently and no other changes in his medical history. He shows me a tracing from his Lourena Simmonds, which appears to be Afib. He states his rates are in the 130s when he has rapid heart rate and palpitations.   Past Medical History:  Diagnosis Date  . Fibromyalgia   . Hypertension   . Mixed dyslipidemia     Past Surgical History:  Procedure Laterality Date  . FOOT SURGERY     2009  . HERNIA REPAIR     2006    Current Medications: Current Meds  Medication Sig  . amLODipine (NORVASC) 5 MG tablet Take 1 tablet (5 mg total) by mouth daily.  Marland Kitchen atorvastatin (LIPITOR) 40 MG tablet Take 40 mg by mouth daily.  Marland Kitchen diltiazem (CARDIZEM CD) 180 MG 24 hr capsule Take 1 capsule (180 mg total) by mouth  daily.  Marland Kitchen lisinopril (PRINIVIL,ZESTRIL) 40 MG tablet Take 40 mg by mouth daily.  . metoprolol succinate (TOPROL-XL) 100 MG 24 hr tablet Take 1 tablet (100 mg total) by mouth daily. Take with or immediately following a meal.  . Omega-3 Fatty Acids (FISH OIL) 1000 MG CAPS Take 1,000 mg by mouth daily.  . rivaroxaban (XARELTO) 20 MG TABS tablet Take 1 tablet (20 mg total) by mouth daily with supper.  . [DISCONTINUED] amLODipine (NORVASC) 10 MG tablet Take 10 mg by mouth daily.  . [DISCONTINUED] diltiazem (CARTIA XT) 120 MG 24 hr capsule Take 120 mg daily as needed by mouth (palpitations).     Allergies:   Patient has no known allergies.   Social History   Socioeconomic History  . Marital status: Single    Spouse name: Not on file  . Number of children: Not on file  . Years of education: Not on file  . Highest education level: Not on file  Occupational History  . Not on file  Social Needs  . Financial resource strain: Not on file  . Food insecurity:    Worry: Not on file    Inability: Not on file  . Transportation needs:    Medical: Not on file    Non-medical: Not on file  Tobacco Use  . Smoking status: Never Smoker  . Smokeless tobacco: Never Used  Substance and Sexual Activity  . Alcohol use: Yes    Alcohol/week: 4.0 standard drinks    Types: 4 Cans of beer per week    Comment: occasional  . Drug use: No  . Sexual activity: Not on file  Lifestyle  . Physical activity:    Days per week: Not on file    Minutes per session: Not on file  . Stress: Not on file  Relationships  . Social connections:    Talks on phone: Not on file    Gets together: Not on file    Attends religious service: Not on file    Active member of club or organization: Not on file    Attends meetings of clubs or organizations: Not on file    Relationship status: Not on file  Other Topics Concern  . Not on file  Social History Narrative  . Not on file     Family History: The patient's family  history includes Heart attack in his father; Hypertension in his father and mother; Kidney disease in his brother, father, and sister.  ROS:   Please see the history of present illness.     All other systems reviewed and are negative.  EKGs/Labs/Other Studies Reviewed:    The following studies were reviewed today:  Holter monitor 09/22/17: 24-hour Holter monitor reviewed.  Sinus rhythm is noted.  There is substantial lead motion artifact (with misinterpreted ventricular ectopy).  Rare PACs noted including a 7-8 beat run of regular SVT.  No atrial fibrillation is seen.  Heart rate ranged from 45 bpm up to 73 bpm with average heart rate 59 bpm.  There were no pauses.  EKG:  EKG is ordered today.  The ekg ordered today demonstrates sinus rhythm  Recent Labs: No results found for requested labs within last 8760 hours.  Recent Lipid Panel No results found for: CHOL, TRIG, HDL, CHOLHDL, VLDL, LDLCALC, LDLDIRECT  Physical Exam:    VS:  BP 118/72 (BP Location: Left Arm, Patient Position: Sitting, Cuff Size: Normal)   Pulse 66   Ht 5\' 9"  (1.753 m)   Wt 182 lb 3.2 oz (82.6 kg)   BMI 26.91 kg/m     Wt Readings from Last 3 Encounters:  08/05/18 182 lb 3.2 oz (82.6 kg)  09/07/17 183 lb 3.2 oz (83.1 kg)  09/11/16 183 lb (83 kg)     GEN: Well nourished, well developed in no acute distress HEENT: Normal NECK: No JVD; No carotid bruits CARDIAC: RRR, no murmurs, rubs, gallops RESPIRATORY:  Clear to auscultation without rales, wheezing or rhonchi  ABDOMEN: Soft, non-tender, non-distended MUSCULOSKELETAL:  No edema; No deformity  SKIN: Warm and dry NEUROLOGIC:  Alert and oriented x 3 PSYCHIATRIC:  Normal affect   ASSESSMENT:    1. Paroxysmal atrial fibrillation (HCC)   2. Long term current use of anticoagulant   3. Essential hypertension   4. Dyslipidemia    PLAN:    In order of problems listed above:  Paroxysmal atrial fibrillation (HCC) - Plan: EKG 12-Lead It sounds as though  he is having frequent breakthrough episodes of atrial fibrillation with RVR.  I will increase his Cartia 180 mg daily.  I will also add PRN Cardizem 15 mg for palpitations or rapid heart rate for longer than 1 hour.  I chose a conservative dose since his baseline heart rate is generally in the 60s.  He is compliant on Xarelto.  We discussed the plan and he is in agreement.  If he  continues to have rapid heart rate and palpitations daily he will call the office and we will make further medication adjustments.   Long term current use of anticoagulant Continue Xarelto. No bleeding problems. This patients CHA2DS2-VASc Score and unadjusted Ischemic Stroke Rate (% per year) is equal to 0.6 % stroke rate/year from a score of 1 (HTN).   Essential hypertension Pressure today is 118/72.  To allow room for the increased cardia dose, I decreased Norvasc to 5 mg daily.   Dyslipidemia He is on 40 mg of atorvastatin and fish oil.   Plan to follow-up as scheduled with Dr. Teodoro Spray next month.   Medication Adjustments/Labs and Tests Ordered: Current medicines are reviewed at length with the patient today.  Concerns regarding medicines are outlined above.  Orders Placed This Encounter  Procedures  . EKG 12-Lead   Meds ordered this encounter  Medications  . diltiazem (CARDIZEM) 30 MG tablet    Sig: Take 0.5 tablets (15 mg total) by mouth daily as needed (for heart rate greater than 100 for more than 1 hour).    Dispense:  30 tablet    Refill:  2  . amLODipine (NORVASC) 5 MG tablet    Sig: Take 1 tablet (5 mg total) by mouth daily.    Dispense:  30 tablet    Refill:  6  . diltiazem (CARDIZEM CD) 180 MG 24 hr capsule    Sig: Take 1 capsule (180 mg total) by mouth daily.    Dispense:  30 capsule    Refill:  6    Signed, Marcelino Duster, [place]  08/05/2018 11:00 AM    [facility]

## 2018-08-05 ENCOUNTER — Encounter: Payer: Self-pay | Admitting: Physician Assistant

## 2018-08-05 ENCOUNTER — Ambulatory Visit: Payer: BLUE CROSS/BLUE SHIELD | Admitting: Physician Assistant

## 2018-08-05 VITALS — BP 118/72 | HR 66 | Ht 69.0 in | Wt 182.2 lb

## 2018-08-05 DIAGNOSIS — Z7901 Long term (current) use of anticoagulants: Secondary | ICD-10-CM

## 2018-08-05 DIAGNOSIS — I10 Essential (primary) hypertension: Secondary | ICD-10-CM | POA: Diagnosis not present

## 2018-08-05 DIAGNOSIS — E785 Hyperlipidemia, unspecified: Secondary | ICD-10-CM

## 2018-08-05 DIAGNOSIS — I480 Paroxysmal atrial fibrillation: Secondary | ICD-10-CM | POA: Diagnosis not present

## 2018-08-05 DIAGNOSIS — H40023 Open angle with borderline findings, high risk, bilateral: Secondary | ICD-10-CM | POA: Diagnosis not present

## 2018-08-05 MED ORDER — DILTIAZEM HCL 30 MG PO TABS: 15.0000 mg | ORAL_TABLET | Freq: Every day | ORAL | 2 refills | Status: DC | PRN

## 2018-08-05 MED ORDER — DILTIAZEM HCL ER COATED BEADS 180 MG PO CP24: 180.0000 mg | ORAL_CAPSULE | Freq: Every day | ORAL | 6 refills | Status: DC

## 2018-08-05 MED ORDER — AMLODIPINE BESYLATE 5 MG PO TABS: 5.0000 mg | ORAL_TABLET | Freq: Every day | ORAL | 6 refills | Status: DC

## 2018-08-05 NOTE — Patient Instructions (Signed)
Medication Instructions:  Decrease Amlodipine to 5 mg daily.  Increase Diltiazem to 180 mg daily.  START Cardizem 30 mg--take 1/2 tab (15 mg) as needed for palpitations or heart rate that is over 100 for more than 1 hour.  If you need a refill on your cardiac medications before your next appointment, please call your pharmacy.   Lab work: NONE If you have labs (blood work) drawn today and your tests are completely normal, you will receive your results only by: Marland Kitchen MyChart Message (if you have MyChart) OR . A paper copy in the mail If you have any lab test that is abnormal or we need to change your treatment, we will call you to review the results.  Testing/Procedures: NONE  Follow-Up: At [facility], you and your health needs are our priority.  As part of our continuing mission to provide you with exceptional heart care, we have created designated Provider Care Teams.  These Care Teams include your primary Cardiologist (physician) and Advanced Practice Providers (APPs -  Physician Assistants and Nurse Practitioners) who all work together to provide you with the care you need, when you need it. . Please keep your scheduled follow up appointment with Dr. Royann Shivers in November.  Any Other Special Instructions Will Be Listed Below (If Applicable). NONE

## 2018-08-05 NOTE — Progress Notes (Signed)
It may be time for true antiarrhythmics such as multaq or dofetilide. He will prefer multaq since it does not require admission to the hospital. If no improvement will offer Multaq 400 mg BID. MCr

## 2018-08-23 DIAGNOSIS — J322 Chronic ethmoidal sinusitis: Secondary | ICD-10-CM | POA: Diagnosis not present

## 2018-09-02 DIAGNOSIS — H40053 Ocular hypertension, bilateral: Secondary | ICD-10-CM | POA: Diagnosis not present

## 2018-09-08 ENCOUNTER — Encounter: Payer: Self-pay | Admitting: Cardiovascular Disease

## 2018-09-08 ENCOUNTER — Ambulatory Visit: Payer: BLUE CROSS/BLUE SHIELD | Admitting: Cardiovascular Disease

## 2018-09-08 VITALS — BP 142/91 | HR 57 | Ht 69.0 in | Wt 182.6 lb

## 2018-09-08 DIAGNOSIS — E7800 Pure hypercholesterolemia, unspecified: Secondary | ICD-10-CM | POA: Diagnosis not present

## 2018-09-08 DIAGNOSIS — I480 Paroxysmal atrial fibrillation: Secondary | ICD-10-CM | POA: Diagnosis not present

## 2018-09-08 DIAGNOSIS — I10 Essential (primary) hypertension: Secondary | ICD-10-CM

## 2018-09-08 DIAGNOSIS — I77810 Thoracic aortic ectasia: Secondary | ICD-10-CM

## 2018-09-08 DIAGNOSIS — Z7901 Long term (current) use of anticoagulants: Secondary | ICD-10-CM

## 2018-09-08 MED ORDER — AMLODIPINE BESYLATE 10 MG PO TABS: 10.0000 mg | ORAL_TABLET | Freq: Every day | ORAL | Status: AC

## 2018-09-08 NOTE — Progress Notes (Signed)
Patient ID: Alexander Reyes, male   DOB: Apr 30, 1956, 62 y.o.   MRN: [id number]     Cardiology Office Note    Date:  09/08/2018   ID:  Alexander Reyes, DOB 1956-07-29, MRN [id number] FAA: Levester Fresh. Earlene Plater, MD PCP:  Catha Gosselin, MD  Cardiologist:   Thurmon Fair, MD   chief complaint: Follow-up atrial fibrillation.   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   He had a flare of palpitations related to an upper respiratory infection, but these have resolved in parallel with the respiratory symptoms.  He has stopped taking the diltiazem.  He increase his amlodipine back to the 10 mg dose.  He feels much better.  The patient specifically denies any chest pain at rest exertion, dyspnea at rest or with exertion, orthopnea, paroxysmal nocturnal dyspnea, syncope, palpitations, focal neurological deficits, intermittent claudication, lower extremity edema, unexplained weight gain, cough, hemoptysis or wheezing.  At home his typical blood pressure is 125/80.  His blood pressure today was a little bit higher and he promises to keep a record of blood pressure readings at home for a few days.  He has been taking Xarelto without interruption and tolerates it well, without any bleeding complications or other side effects.  He is physically active and very fit.  He works as a Passenger transport manager for [employer]. FAA requests an updated 24-hour Holter monitor and echocardiogram.  He is very mildly overweight, but his weight has been steady, virtually unchanged from last year.   His EKG today does show some T wave inversion in the lateral leads.  These have occurred intermittently his ECGs and sometimes normalized.  After a similar EKG in 2017 we performed a stress myocardial  nuclear perfusion study that showed normal results.  Similar changes on on ECG were seen as far back as 15-20 years ago  A 24-hour Holter monitor in 2017, after his admission with atrial fibrillation, showed extremely rare PACs and PVCs and was otherwise normal. An exercise myocardial perfusion study was completely normal. EF was 57%. He was able to exercise for 7 minutes and 30 seconds. Echocardiogram also showed normal left ventricular systolic function and there were no valvular abnormalities. Left ventricular walls are borderline hypertrophic. The left atrial diameter was 37 mm, at the upper limit of normal in size. While there was no overt diastolic dysfunction, his tissue Doppler early diastolic velocities were slightly low. Estimated PA pressure was normal.  He has a long-standing history of systemic hypertension dating back at least 20 years. He has strong family history of hypertension on both maternal and paternal sides. 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 The workup was normal. He has never had a myocardial infarction, congestive heart failure, stroke, peripheral embolic events, TIA or other major serious medical problems. He does take a statin for hyperlipidemia. He does not smoke he does not have diabetes mellitus.    Past Medical History:  Diagnosis Date  . Fibromyalgia   . Hypertension   . Mixed dyslipidemia     Past Surgical History:  Procedure Laterality Date  . FOOT SURGERY     2009  . HERNIA REPAIR     2006    Current Medications: Outpatient Medications Prior to  Visit  Medication Sig Dispense Refill  . atorvastatin (LIPITOR) 40 MG tablet Take 40 mg by mouth daily.    Marland Kitchen. diltiazem (CARDIZEM) 30 MG tablet Take 0.5 tablets (15 mg total) by mouth daily as needed (for heart rate greater than 100 for more than 1 hour). 30 tablet 2  . lisinopril (PRINIVIL,ZESTRIL) 40 MG  tablet Take 40 mg by mouth daily.    . metoprolol succinate (TOPROL-XL) 100 MG 24 hr tablet Take 1 tablet (100 mg total) by mouth daily. Take with or immediately following a meal. 30 tablet 1  . Omega-3 Fatty Acids (FISH OIL) 1000 MG CAPS Take 1,000 mg by mouth daily.    . rivaroxaban (XARELTO) 20 MG TABS tablet Take 1 tablet (20 mg total) by mouth daily with supper. 90 tablet 1  . amLODipine (NORVASC) 10 MG tablet Take 1 tablet (10 mg total) by mouth daily. 30 tablet 6  .       No facility-administered medications prior to visit.      Allergies:   Patient has no known allergies.   Social History   Socioeconomic History  . Marital status: Single    Spouse name: Not on file  . Number of children: Not on file  . Years of education: Not on file  . Highest education level: Not on file  Occupational History  . Not on file  Social Needs  . Financial resource strain: Not on file  . Food insecurity:    Worry: Not on file    Inability: Not on file  . Transportation needs:    Medical: Not on file    Non-medical: Not on file  Tobacco Use  . Smoking status: Never Smoker  . Smokeless tobacco: Never Used  Substance and Sexual Activity  . Alcohol use: Yes    Alcohol/week: 4.0 standard drinks    Types: 4 Cans of beer per week    Comment: occasional  . Drug use: No  . Sexual activity: Not on file  Lifestyle  . Physical activity:    Days per week: Not on file    Minutes per session: Not on file  . Stress: Not on file  Relationships  . Social connections:    Talks on phone: Not on file    Gets together: Not on file    Attends religious service: Not on file    Active member of club or organization: Not on file    Attends meetings of clubs or organizations: Not on file    Relationship status: Not on file  Other Topics Concern  . Not on file  Social History Narrative  . Not on file     Family History:  The patient's family history includes Heart attack in his father; Hypertension  in his father and mother; Kidney disease in his brother, father, and sister.   ROS:   Please see the history of present illness.    ROS all other systems are reviewed and are negative   PHYSICAL EXAM:   VS:  BP (!) 142/91   Pulse (!) 57   Ht 5\' 9"  (1.753 m)   Wt 182 lb 9.6 oz (82.8 kg)   BMI 26.97 kg/m      General: Alert, oriented x3, no distress, mildly overweight Head: no evidence of trauma, PERRL, EOMI, no exophtalmos or lid lag, no myxedema, no xanthelasma; normal ears, nose and oropharynx Neck: normal jugular venous pulsations and no hepatojugular reflux; brisk carotid pulses without delay and no carotid bruits  Chest: clear to auscultation, no signs of consolidation by percussion or palpation, normal fremitus, symmetrical and full respiratory excursions Cardiovascular: normal position and quality of the apical impulse, regular rhythm, normal first and second heart sounds, no murmurs, rubs or gallops Abdomen: no tenderness or distention, no masses by palpation, no abnormal pulsatility or arterial bruits, normal bowel sounds, no hepatosplenomegaly Extremities: no clubbing, cyanosis or edema; 2+ radial, ulnar and brachial pulses bilaterally; 2+ right femoral, posterior tibial and dorsalis pedis pulses; 2+ left femoral, posterior tibial and dorsalis pedis pulses; no subclavian or femoral bruits Neurological: grossly nonfocal Psych: Normal mood and affect    Wt Readings from Last 3 Encounters:  09/08/18 182 lb 9.6 oz (82.8 kg)  08/05/18 182 lb 3.2 oz (82.6 kg)  09/07/17 183 lb 3.2 oz (83.1 kg)      Studies/Labs Reviewed:   EKG:  EKG is ordered today.  The ekg shows normal sinus rhythm, normal tracing with T wave inversion in the lateral leads, very similar to the tracing from April 2017, but new compared with a 2018 ECG. Recent Labs: No results found for requested labs within last 8760 hours.  Usually checked by his primary care provider every January  ASSESSMENT:    1.  Paroxysmal atrial fibrillation (HCC)   2. Long term current use of anticoagulant   3. Essential hypertension   4. Hypercholesterolemia   5. Aortic root dilation (HCC)      PLAN:  In order of problems listed above:   1. AFib: Brief exacerbation of palpitations during upper respiratory syndrome, now resolved.  Otherwise his atrial fibrillation has been asymptomatic.  CHA2DS2-VASc Score is 1 (HTN), corresponding to 1 unadjusted Ischemic Stroke Rate of 0.6 % stroke rate/year. On Xarelto 20 mg daily, well-tolerated without bleeding complications.  On beta-blockers for ventricular rate control, well-tolerated also serving as antihypertensive medications.  Control remains good during breakthrough episodes of atrial fibrillation, which are generally brief, never lasting more than a few hours.  True antiarrhythmics are not indicated.  We will check echo and Holter as mandated by the FAA.  If his symptoms become more prevalent, I think he would be an excellent candidate for atrial fibrillation radiofrequency ablation, but this is not necessary at this point. 2.  Xarelto: Well-tolerated, no bleeding complications.  3. HTN: Good blood pressure control, typically 120s/80 range. 4. HLP: He is on a highly effective statin in a reasonably high dose.  Lipid profile monitored by his primary care provider.  Normal results on  2017 stress test.  Target LDL under 100. 5. Ao dilation: Symptomatic, very mild, incidentally detected on echocardiography and will be monitored with yearly echo.  No indication to perform CT angiography at this point.  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  This should include his current clinical status report, the results of the 24-hour Holter and echocardiogram, list of medications, anticoagulant regimen tolerability, CHADSVasc core, status of aortic root dilation.  Medication Adjustments/Labs and Tests Ordered: Current medicines are  reviewed at length with the patient today.  Concerns regarding medicines are outlined above.  Medication changes, Labs and Tests ordered today are listed in the Patient Instructions below. Patient Instructions  Medication Instructions:  Dr Royann Shivers recommends that you continue on your current medications as directed. Please refer to the Current Medication list given to you today.  If you need a refill on your cardiac medications before your next appointment, please call your pharmacy.  Testing/Procedures: 1. Echocardiogram - Your physician has requested that you have an echocardiogram. Echocardiography is a painless test that uses sound waves to create images of your heart. It provides your doctor with information about the size and shape of your heart and how well your heart's chambers and valves are working. This procedure takes approximately one hour. There are no restrictions for this procedure.  2. 24-hour Holter Monitor - Your physician has recommended that you wear a holter monitor. Holter monitors are medical devices that record the heart's electrical activity. Doctors most often use these monitors to diagnose arrhythmias. Arrhythmias are problems with the speed or rhythm of the heartbeat. The monitor is a small, portable device. You can wear one while you do your normal daily activities. This is usually used to diagnose what is causing palpitations/syncope (passing out).  >> These tests will be performed at our [facility] location [address] [place], [address] [place] [address] [phone]  Follow-Up: At [facility], you and your health needs are our priority.  As part of our continuing mission to provide you with exceptional heart care, we have created designated Provider Care Teams.  These Care Teams include your primary Cardiologist (physician) and Advanced Practice Providers (APPs -  Physician Assistants and Nurse Practitioners) who all work together to provide you with the care  you need, when you need it. You will need a follow up appointment in 12 months.  Please call our office 2 months in advance to schedule this appointment.  You may see Thurmon Fair, MD or one of the following Advanced Practice Providers on your designated Care Team: [place], [place] . Micah Flesher, PA-C    Signed, Thurmon Fair, MD  09/08/2018 5:24 PM    [facility] [address] [place], [place], [place]  [address] Phone: [phone]; Fax: [phone]

## 2018-09-08 NOTE — Patient Instructions (Signed)
Medication Instructions:  Dr Royann Shiversroitoru recommends that you continue on your current medications as directed. Please refer to the Current Medication list given to you today.  If you need a refill on your cardiac medications before your next appointment, please call your pharmacy.   Testing/Procedures: 1. Echocardiogram - Your physician has requested that you have an echocardiogram. Echocardiography is a painless test that uses sound waves to create images of your heart. It provides your doctor with information about the size and shape of your heart and how well your heart's chambers and valves are working. This procedure takes approximately one hour. There are no restrictions for this procedure.  2. 24-hour Holter Monitor - Your physician has recommended that you wear a holter monitor. Holter monitors are medical devices that record the heart's electrical activity. Doctors most often use these monitors to diagnose arrhythmias. Arrhythmias are problems with the speed or rhythm of the heartbeat. The monitor is a small, portable device. You can wear one while you do your normal daily activities. This is usually used to diagnose what is causing palpitations/syncope (passing out).  >> These tests will be performed at [facility] [address] [place] [address] [phone]  Follow-Up: At [facility], you and your health needs are our priority.  As part of our continuing mission to provide you with exceptional heart care, we have created designated Provider Care Teams.  These Care Teams include your primary Cardiologist (physician) and Advanced Practice Providers (APPs -  Physician Assistants and Nurse Practitioners) who all work together to provide you with the care you need, when you need it. You will need a follow up appointment in 12 months.  Please call our office 2 months in advance to schedule this appointment.  You may see Thurmon FairMihai Croitoru, MD or one of the following  Advanced Practice Providers on your designated Care Team: [place], [place] . Micah FlesherAngela Duke, PA-C

## 2018-09-19 ENCOUNTER — Ambulatory Visit (HOSPITAL_BASED_OUTPATIENT_CLINIC_OR_DEPARTMENT_OTHER)
Admission: RE | Admit: 2018-09-19 | Discharge: 2018-09-19 | Disposition: A | Discharge status: Home / Self Care | Payer: BLUE CROSS/BLUE SHIELD | Source: Ambulatory Visit | Attending: Cardiovascular Disease | Admitting: Cardiovascular Disease

## 2018-09-19 ENCOUNTER — Ambulatory Visit (HOSPITAL_COMMUNITY)
Admission: RE | Admit: 2018-09-19 | Discharge: 2018-09-19 | Disposition: A | Discharge status: Home / Self Care | Payer: BLUE CROSS/BLUE SHIELD | Source: Ambulatory Visit | Attending: Cardiovascular Disease | Admitting: Cardiovascular Disease

## 2018-09-19 DIAGNOSIS — I480 Paroxysmal atrial fibrillation: Secondary | ICD-10-CM | POA: Diagnosis not present

## 2018-09-19 NOTE — Progress Notes (Signed)
*  PRELIMINARY RESULTS* Echocardiogram 2D Echocardiogram has been performed.  Alexander Reyes, Alexander Reyes 09/19/2018, 11:56 AM

## 2018-11-23 ENCOUNTER — Other Ambulatory Visit: Payer: Self-pay | Admitting: Cardiovascular Disease

## 2018-11-23 DIAGNOSIS — I480 Paroxysmal atrial fibrillation: Secondary | ICD-10-CM

## 2018-11-23 NOTE — Telephone Encounter (Signed)
Please review for refill, Thanks !  

## 2018-11-23 NOTE — Telephone Encounter (Signed)
Called and left pt a msg let them know that they cannot get a refill until the labs are done. Labs ordered and instructed pt to come because appts aren't necessary. Ordered exactly one month of xarelto

## 2018-11-24 ENCOUNTER — Other Ambulatory Visit: Payer: Self-pay | Admitting: *Deleted

## 2018-11-24 DIAGNOSIS — I480 Paroxysmal atrial fibrillation: Secondary | ICD-10-CM

## 2018-11-24 LAB — COMPREHENSIVE METABOLIC PANEL
A/G RATIO: 1.8 (ref 1.2–2.2)
ALT: 42 IU/L (ref 0–44)
AST: 27 IU/L (ref 0–40)
Albumin: 4.2 g/dL (ref 3.8–4.9)
Alkaline Phosphatase: 71 IU/L (ref 39–117)
BUN/Creatinine Ratio: 14 (ref 10–24)
BUN: 14 mg/dL (ref 8–27)
Bilirubin Total: 0.5 mg/dL (ref 0.0–1.2)
CALCIUM: 9 mg/dL (ref 8.6–10.2)
CO2: 21 mmol/L (ref 20–29)
Chloride: 100 mmol/L (ref 96–106)
Creatinine, Ser: 0.99 mg/dL (ref 0.76–1.27)
GFR, EST AFRICAN AMERICAN: 95 mL/min/{1.73_m2} (ref >59)
GFR, EST NON AFRICAN AMERICAN: 82 mL/min/{1.73_m2} (ref >59)
GLOBULIN, TOTAL: 2.3 g/dL (ref 1.5–4.5)
Glucose: 90 mg/dL (ref 65–99)
POTASSIUM: 4.2 mmol/L (ref 3.5–5.2)
SODIUM: 137 mmol/L (ref 134–144)
TOTAL PROTEIN: 6.5 g/dL (ref 6.0–8.5)

## 2018-11-25 ENCOUNTER — Telehealth: Payer: Self-pay | Admitting: Cardiovascular Disease

## 2018-11-25 NOTE — Telephone Encounter (Signed)
Pt aware of lab results ./cy 

## 2018-11-25 NOTE — Telephone Encounter (Signed)
New Message ° ° ° °Patient is returning call in reference to lab results. Please call.  °

## 2018-11-29 ENCOUNTER — Telehealth: Payer: Self-pay

## 2018-11-29 NOTE — Telephone Encounter (Signed)
Prior authorization for renewal of Xarelto approved.[id number].

## 2019-01-10 DIAGNOSIS — H40053 Ocular hypertension, bilateral: Secondary | ICD-10-CM | POA: Diagnosis not present

## 2019-01-12 ENCOUNTER — Other Ambulatory Visit: Payer: Self-pay | Admitting: Cardiovascular Disease

## 2019-03-13 DIAGNOSIS — H40053 Ocular hypertension, bilateral: Secondary | ICD-10-CM | POA: Diagnosis not present

## 2019-03-17 ENCOUNTER — Other Ambulatory Visit: Payer: Self-pay | Admitting: Pharmacist Clinician (PhC)/ Clinical Pharmacy Specialist

## 2019-03-17 MED ORDER — RIVAROXABAN 20 MG PO TABS: ORAL_TABLET | ORAL | 1 refills | Status: DC

## 2019-03-22 ENCOUNTER — Telehealth: Payer: Self-pay | Admitting: Physician Assistant

## 2019-03-22 NOTE — Telephone Encounter (Signed)
smartphone/ consent/ my chart/ pre reg completed °

## 2019-03-24 ENCOUNTER — Encounter: Payer: Self-pay | Admitting: Physician Assistant

## 2019-03-24 ENCOUNTER — Telehealth: Payer: Self-pay

## 2019-03-24 ENCOUNTER — Other Ambulatory Visit: Payer: Self-pay | Admitting: Physician Assistant

## 2019-03-24 ENCOUNTER — Telehealth (INDEPENDENT_AMBULATORY_CARE_PROVIDER_SITE_OTHER): Payer: BLUE CROSS/BLUE SHIELD | Admitting: Physician Assistant

## 2019-03-24 VITALS — BP 140/90 | HR 70 | Ht 69.0 in | Wt 182.0 lb

## 2019-03-24 DIAGNOSIS — I480 Paroxysmal atrial fibrillation: Secondary | ICD-10-CM

## 2019-03-24 DIAGNOSIS — I493 Ventricular premature depolarization: Secondary | ICD-10-CM | POA: Diagnosis not present

## 2019-03-24 DIAGNOSIS — E785 Hyperlipidemia, unspecified: Secondary | ICD-10-CM

## 2019-03-24 DIAGNOSIS — I10 Essential (primary) hypertension: Secondary | ICD-10-CM

## 2019-03-24 MED ORDER — FLECAINIDE ACETATE 50 MG PO TABS: 50.0000 mg | ORAL_TABLET | Freq: Two times a day (BID) | ORAL | 3 refills | Status: DC

## 2019-03-24 NOTE — Telephone Encounter (Signed)
Virtual Visit Pre-Appointment Phone Call  "(Name), I am calling you today to discuss your upcoming appointment. We are currently trying to limit exposure to the virus that causes COVID-19 by seeing patients at home rather than in the office."  1. "What is the BEST phone number to call the day of the visit?" - include this in appointment notes  2. "Do you have or have access to (through a family member/friend) a smartphone with video capability that we can use for your visit?" a. If yes - list this number in appt notes as "cell" (if different from BEST phone #) and list the appointment type as a VIDEO visit in appointment notes b. If no - list the appointment type as a PHONE visit in appointment notes  3. Confirm consent - "In the setting of the current Covid19 crisis, you are scheduled for a (phone or video) visit with your provider on (date) at (time).  Just as we do with many in-office visits, in order for you to participate in this visit, we must obtain consent.  If you'd like, I can send this to your mychart (if signed up) or email for you to review.  Otherwise, I can obtain your verbal consent now.  All virtual visits are billed to your insurance company just like a normal visit would be.  By agreeing to a virtual visit, we'd like you to understand that the technology does not allow for your provider to perform an examination, and thus may limit your provider's ability to fully assess your condition. If your provider identifies any concerns that need to be evaluated in person, we will make arrangements to do so.  Finally, though the technology is pretty good, we cannot assure that it will always work on either your or our end, and in the setting of a video visit, we may have to convert it to a phone-only visit.  In either situation, we cannot ensure that we have a secure connection.  Are you willing to proceed?" STAFF: Did the patient verbally acknowledge consent to telehealth visit? Document  YES/NO here: YES  4. Advise patient to be prepared - "Two hours prior to your appointment, go ahead and check your blood pressure, pulse, oxygen saturation, and your weight (if you have the equipment to check those) and write them all down. When your visit starts, your provider will ask you for this information. If you have an Apple Watch or Kardia device, please plan to have heart rate information ready on the day of your appointment. Please have a pen and paper handy nearby the day of the visit as well."  5. Give patient instructions for MyChart download to smartphone OR Doximity/Doxy.me as below if video visit (depending on what platform provider is using)  6. Inform patient they will receive a phone call 15 minutes prior to their appointment time (may be from unknown caller ID) so they should be prepared to answer    TELEPHONE CALL NOTE  Alexander Reyes has been deemed a candidate for a follow-up tele-health visit to limit community exposure during the Covid-19 pandemic. I spoke with the patient via phone to ensure availability of phone/video source, confirm preferred email & phone number, and discuss instructions and expectations.  I reminded Alexander Reyes to be prepared with any vital sign and/or heart rhythm information that could potentially be obtained via home monitoring, at the time of his visit. I reminded Alexander Reyes to expect a phone call prior to his visit.  Dorris Fetcherrah Tonishia Steffy, CMA 03/24/2019 9:16 AM   INSTRUCTIONS FOR DOWNLOADING THE MYCHART APP TO SMARTPHONE  - The patient must first make sure to have activated MyChart and know their login information - If Apple, go to [employer] Store and type in MyChart in the search bar and download the app. If Android, ask patient to go to [employer] Play Store and type in [place] in the search bar and download the app. The app is free but as with any other app downloads, their phone may require them to verify saved payment information or Apple/Android  password.  - The patient will need to then log into the app with their MyChart username and password, and select [facility] as their healthcare provider to link the account. When it is time for your visit, go to the MyChart app, find appointments, and click Begin Video Visit. Be sure to Select Allow for your device to access the Microphone and Camera for your visit. You will then be connected, and your provider will be with you shortly.  **If they have any issues connecting, or need assistance please contact MyChart service desk [phone])**  **If using a computer, in order to ensure the best quality for their visit they will need to use either of the following Internet Browsers: [employer] Edge, or Google Chrome**  IF USING DOXIMITY or DOXY.ME - The patient will receive a link just prior to their visit by text.     FULL LENGTH CONSENT FOR TELE-HEALTH VISIT   I hereby voluntarily request, consent and authorize [facility] and its employed or contracted physicians, physician assistants, nurse practitioners or other licensed health care professionals (the Practitioner), to provide me with telemedicine health care services (the "Services") as deemed necessary by the treating Practitioner. I acknowledge and consent to receive the Services by the Practitioner via telemedicine. I understand that the telemedicine visit will involve communicating with the Practitioner through live audiovisual communication technology and the disclosure of certain medical information by electronic transmission. I acknowledge that I have been given the opportunity to request an in-person assessment or other available alternative prior to the telemedicine visit and am voluntarily participating in the telemedicine visit.  I understand that I have the right to withhold or withdraw my consent to the use of telemedicine in the course of my care at any time, without affecting my right to future care or treatment,  and that the Practitioner or I may terminate the telemedicine visit at any time. I understand that I have the right to inspect all information obtained and/or recorded in the course of the telemedicine visit and may receive copies of available information for a reasonable fee.  I understand that some of the potential risks of receiving the Services via telemedicine include:  Marland Kitchen. Delay or interruption in medical evaluation due to technological equipment failure or disruption; . Information transmitted may not be sufficient (e.g. poor resolution of images) to allow for appropriate medical decision making by the Practitioner; and/or  . In rare instances, security protocols could fail, causing a breach of personal health information.  Furthermore, I acknowledge that it is my responsibility to provide information about my medical history, conditions and care that is complete and accurate to the best of my ability. I acknowledge that Practitioner's advice, recommendations, and/or decision may be based on factors not within their control, such as incomplete or inaccurate data provided by me or distortions of diagnostic images or specimens that may result from electronic transmissions. I understand that the  practice of medicine is not an Chief Strategy Officer and that Practitioner makes no warranties or guarantees regarding treatment outcomes. I acknowledge that I will receive a copy of this consent concurrently upon execution via email to the email address I last provided but may also request a printed copy by calling the office of [facility].    I understand that my insurance will be billed for this visit.   I have read or had this consent read to me. . I understand the contents of this consent, which adequately explains the benefits and risks of the Services being provided via telemedicine.  . I have been provided ample opportunity to ask questions regarding this consent and the Services and have had my questions  answered to my satisfaction. . I give my informed consent for the services to be provided through the use of telemedicine in my medical care  By participating in this telemedicine visit I agree to the above.

## 2019-03-24 NOTE — Patient Instructions (Signed)
Medication Instructions:   START FLECAINIDE 50 Mg 2 times a day  If you need a refill on your cardiac medications before your next appointment, please call your pharmacy.   Lab work:  NONE ordered at this time of appointment   If you have labs (blood work) drawn today and your tests are completely normal, you will receive your results only by: Marland Kitchen MyChart Message (if you have MyChart) OR . A paper copy in the mail If you have any lab test that is abnormal or we need to change your treatment, we will call you to review the results.  Testing/Procedures:  Your physician has requested that you have an exercise tolerance test. For further information please visit [email or web address] Please also follow instruction sheet, as given.      Follow-Up: At [facility], you and your health needs are our priority.  As part of our continuing mission to provide you with exceptional heart care, we have created designated Provider Care Teams.  These Care Teams include your primary Cardiologist (physician) and Advanced Practice Providers (APPs -  Physician Assistants and Nurse Practitioners) who all work together to provide you with the care you need, when you need it. You will need a follow up appointment in 6 weeks.  Please call our office 2 months in advance to schedule this appointment.  You may see Thurmon Fair, MD or one of the following Advanced Practice Providers on your designated Care Team: [place], [place] . Micah Flesher, PA-C  Any Other Special Instructions Will Be Listed Below (If Applicable).

## 2019-03-24 NOTE — Progress Notes (Signed)
Virtual Visit via Telephone Note   This visit type was conducted due to national recommendations for restrictions regarding the COVID-19 Pandemic (e.g. social distancing) in an effort to limit this patient's exposure and mitigate transmission in our community.  Due to his co-morbid illnesses, this patient is at least at moderate risk for complications without adequate follow up.  This format is felt to be most appropriate for this patient at this time.  The patient did not have access to video technology/had technical difficulties with video requiring transitioning to audio format only (telephone).  All issues noted in this document were discussed and addressed.  No physical exam could be performed with this format.  Please refer to the patient's chart for his  consent to telehealth for [facility].   Date:  03/24/2019   ID:  Alexander Reyes, DOB [id number], MRN [id number]  Patient Location: Home Provider Location: Home  PCP:  Catha Gosselin, MD  Cardiologist:  Thurmon Fair, MD  Electrophysiologist:  None   Evaluation Performed:  Follow-Up Visit  Chief Complaint: Recurrent atrial fibrillation  History of Present Illness:    Alexander Reyes is a 63 y.o. male with past medical history of hypertension, fibromyalgia, hyperlipidemia and infrequent paroxysmal atrial fibrillation on Xarelto.  He works as a Passenger transport manager.  He had a normal Myoview in 2017 despite having intermittent T wave inversion in the lateral leads.  Echocardiogram showed normal LV EF, no significant valvular abnormality, left atrial size 37 mm which was near the upper limit of normal.  24-hour Holter monitor in 2017 showed extremely rare PAC and PVCs, otherwise normal.  Patient was last seen by Dr. Royann Shivers in November 2019 at which time he was doing well.  Repeat echocardiogram obtained on 09/19/2018 as part of his FAA clearance showed EF 55 to 60%, mildly dilated left atrium.  Holter monitor obtained on 09/19/2018  showed rare PAC and PVC with no sustained arrhythmia.   Patient was contacted today via telephone visit.  For the past 2 to 3 months, he has been having recurrent episode of atrial fibrillation occurring at night.  This usually lasts several hours at the time.  He denies any dizziness, shortness of breath or chest pain.  The symptom occurs about 2-3 times a week.  He sent a strip from [employer] to Dr. Royann Shivers which I have reviewed as well, this clearly demonstrated atrial fibrillation.  Dr. Royann Shivers recommended antiarrhythmic therapy, I discussed the benefit and risk of the cardiac medication as well with the patient.  He did his own research and wished to try the flecainide first which I think is quite reasonable.  He does not have any prior history of structural heart disease.  I will start him on 50 mg twice daily with plan to continue to go up if he has any breakthrough arrhythmia.  I did recommend a 1 to 2 weeks plain old treadmill test to look for ventricular arrhythmia on the flecainide.  The patient does not have symptoms concerning for COVID-19 infection (fever, chills, cough, or new shortness of breath).    Past Medical History:  Diagnosis Date   Fibromyalgia    Hypertension    Mixed dyslipidemia    Past Surgical History:  Procedure Laterality Date   FOOT SURGERY     2009   HERNIA REPAIR     2006     Current Meds  Medication Sig   amLODipine (NORVASC) 10 MG tablet Take 1 tablet (10 mg total) by mouth daily.  atorvastatin (LIPITOR) 40 MG tablet Take 40 mg by mouth daily.   diltiazem (CARTIA XT) 180 MG 24 hr capsule TAKE 1 CAPSULE BY MOUTH ONCE DAILY   lisinopril (PRINIVIL,ZESTRIL) 40 MG tablet Take 40 mg by mouth daily.   metoprolol succinate (TOPROL-XL) 100 MG 24 hr tablet Take 1 tablet (100 mg total) by mouth daily. Take with or immediately following a meal.   Omega-3 Fatty Acids (FISH OIL) 1000 MG CAPS Take 1,000 mg by mouth daily.   rivaroxaban (XARELTO) 20 MG  TABS tablet TAKE 1 TABLET BY MOUTH ONCE DAILY WITH SUPPER     Allergies:   Patient has no known allergies.   Social History   Tobacco Use   Smoking status: Never Smoker   Smokeless tobacco: Never Used  Substance Use Topics   Alcohol use: Yes    Alcohol/week: 4.0 standard drinks    Types: 4 Cans of beer per week    Comment: occasional   Drug use: No     Family Hx: The patient's family history includes Heart attack in his father; Hypertension in his father and mother; Kidney disease in his brother, father, and sister.  ROS:   Please see the history of present illness.     All other systems reviewed and are negative.   Prior CV studies:   The following studies were reviewed today:  Echo 09/19/2018 LV EF: 55% -   60% Study Conclusions  - Left ventricle: The cavity size was normal. Systolic function was   normal. The estimated ejection fraction was in the range of 55%   to 60%. Wall motion was normal; there were no regional wall   motion abnormalities. Left ventricular diastolic function   parameters were normal. - Left atrium: The atrium was mildly dilated. - Atrial septum: No defect or patent foramen ovale was identified.   Holter 09/19/2018 Study Highlights   24-hour Holter monitor reviewed.  Sinus rhythm is present throughout.  Heart rate ranged from 48 bpm up to 88 bpm with average heart rate 61 bpm.  Rare PACs and PVCs are noted with intermittently documented lead artifact as well.  No sustained arrhythmias or pauses.  No symptoms reported.     Labs/Other Tests and Data Reviewed:    EKG:  An ECG dated 09/08/2018 was personally reviewed today and demonstrated:  Sinus bradycardia with T wave inversions in the lateral leads  Recent Labs: 11/24/2018: ALT 42; BUN 14; Creatinine, Ser 0.99; Potassium 4.2; Sodium 137   Recent Lipid Panel No results found for: CHOL, TRIG, HDL, CHOLHDL, LDLCALC, LDLDIRECT  Wt Readings from Last 3 Encounters:  03/24/19 182 lb  (82.6 kg)  09/08/18 182 lb 9.6 oz (82.8 kg)  08/05/18 182 lb 3.2 oz (82.6 kg)     Objective:    Vital Signs:  BP 140/90    Pulse 70    Ht 5\' 9"  (1.753 m)    Wt 182 lb (82.6 kg)    BMI 26.88 kg/m    VITAL SIGNS:  reviewed  ASSESSMENT & PLAN:    1. Paroxysmal atrial fibrillation  -Patient has been having increasing frequency of atrial fibrillation recently.  I have discussed with him multiple choices including multaq, flecainide, Tikosyn and sotalol.  He is interested to try flecainide at this time.  I will order 50 mg twice daily dosing.  He will need a plain old treadmill test in 1 to 2 weeks to assess for ventricular ectopy.  Continue Xarelto  2. Hypertension: Blood pressure is  borderline high, however normally his blood pressure is very well controlled  3. Hyperlipidemia: On diltiazem 180 mg daily and metoprolol 100 mg daily.  Given bradycardia, I cannot further uptitrate his rate control therapy.   COVID-19 Education: The signs and symptoms of COVID-19 were discussed with the patient and how to seek care for testing (follow up with PCP or arrange E-visit).  The importance of social distancing was discussed today.  Time:   Today, I have spent 12 minutes with the patient with telehealth technology discussing the above problems.     Medication Adjustments/Labs and Tests Ordered: Current medicines are reviewed at length with the patient today.  Concerns regarding medicines are outlined above.   Tests Ordered: No orders of the defined types were placed in this encounter.   Medication Changes: No orders of the defined types were placed in this encounter.   Disposition:  Follow up in 6 week(s)  Signed, Azalee Course, PA  03/24/2019 7:57 AM    [facility]

## 2019-03-30 DIAGNOSIS — N400 Enlarged prostate without lower urinary tract symptoms: Secondary | ICD-10-CM | POA: Diagnosis not present

## 2019-03-30 DIAGNOSIS — E782 Mixed hyperlipidemia: Secondary | ICD-10-CM | POA: Diagnosis not present

## 2019-03-30 DIAGNOSIS — I10 Essential (primary) hypertension: Secondary | ICD-10-CM | POA: Diagnosis not present

## 2019-03-30 DIAGNOSIS — R8290 Unspecified abnormal findings in urine: Secondary | ICD-10-CM | POA: Diagnosis not present

## 2019-03-30 DIAGNOSIS — Z0000 Encounter for general adult medical examination without abnormal findings: Secondary | ICD-10-CM | POA: Diagnosis not present

## 2019-04-06 ENCOUNTER — Telehealth: Payer: Self-pay | Admitting: Cardiovascular Disease

## 2019-04-06 DIAGNOSIS — Z79899 Other long term (current) drug therapy: Secondary | ICD-10-CM

## 2019-04-06 MED ORDER — FLECAINIDE ACETATE 50 MG PO TABS: 75.0000 mg | ORAL_TABLET | Freq: Two times a day (BID) | ORAL | 3 refills | Status: DC

## 2019-04-06 NOTE — Telephone Encounter (Signed)
Patient called stating flecainide (TAMBOCOR) 50 MG tablet current dose is not working for him.  He feels the dose needs to be increased.

## 2019-04-06 NOTE — Telephone Encounter (Signed)
Spoke with pt. He report he is now having episodes of afib twice a day lasting roughly 1-2 hours. Pt state per last conversation with Dr. Loletha Grayer, he mentioned that if symptoms didn't improve that may could increase flecainide dose. Pt inquiring if Dr. Loletha Grayer feels this is now necessary. Will route to MD

## 2019-04-06 NOTE — Telephone Encounter (Signed)
Pt updated and new orders placed. Pt voiced understanding.

## 2019-04-06 NOTE — Telephone Encounter (Signed)
Ok to increase flecainide to 75 mg twice daily. Needs to keep appt for 07/01 stress tst and check BMET and Mg on day of stress test, please

## 2019-04-19 ENCOUNTER — Telehealth (HOSPITAL_COMMUNITY): Payer: Self-pay

## 2019-04-19 ENCOUNTER — Telehealth: Payer: Self-pay

## 2019-04-19 NOTE — Telephone Encounter (Signed)
Left a detailed message for the patient about getting scheduled for COVID test before his GXT

## 2019-04-19 NOTE — Telephone Encounter (Signed)
Routed to Terrah CMA 

## 2019-04-19 NOTE — Telephone Encounter (Signed)
Left a detailed voice message for the for him to get scheduled for his COVID test before his procedure.

## 2019-04-19 NOTE — Telephone Encounter (Signed)
Patient returned call and patient is scheduled for the COIVD test and he ETT will be rescheduled for 04-27-2019 at 10:30AM.

## 2019-04-19 NOTE — Telephone Encounter (Signed)
New message   The patient appointment was made on March 27, 2019, for ETT.  The patient will need COVID testing before his appt on July 1st.

## 2019-04-24 ENCOUNTER — Other Ambulatory Visit (HOSPITAL_COMMUNITY)
Admission: RE | Admit: 2019-04-24 | Discharge: 2019-04-24 | Disposition: A | Discharge status: Home / Self Care | Payer: BC Managed Care – PPO | Source: Ambulatory Visit | Attending: Physician Assistant | Admitting: Physician Assistant

## 2019-04-24 DIAGNOSIS — Z01812 Encounter for preprocedural laboratory examination: Secondary | ICD-10-CM | POA: Diagnosis not present

## 2019-04-24 DIAGNOSIS — Z1159 Encounter for screening for other viral diseases: Secondary | ICD-10-CM | POA: Diagnosis not present

## 2019-04-24 LAB — SARS CORONAVIRUS 2 (TAT 6-24 HRS): SARS Coronavirus 2: NEGATIVE

## 2019-04-25 DIAGNOSIS — H40053 Ocular hypertension, bilateral: Secondary | ICD-10-CM | POA: Diagnosis not present

## 2019-04-26 ENCOUNTER — Telehealth (HOSPITAL_COMMUNITY): Payer: Self-pay | Admitting: *Deleted

## 2019-04-26 ENCOUNTER — Ambulatory Visit (HOSPITAL_COMMUNITY)
Admission: RE | Admit: 2019-04-26 | Payer: BC Managed Care – PPO | Source: Ambulatory Visit | Attending: Physician Assistant | Admitting: Physician Assistant

## 2019-04-26 NOTE — Telephone Encounter (Signed)
Close encounter 

## 2019-04-27 ENCOUNTER — Ambulatory Visit (HOSPITAL_COMMUNITY)
Admission: RE | Admit: 2019-04-27 | Discharge: 2019-04-27 | Disposition: A | Discharge status: Home / Self Care | Payer: BC Managed Care – PPO | Source: Ambulatory Visit | Attending: Cardiology | Admitting: Cardiology

## 2019-04-27 ENCOUNTER — Other Ambulatory Visit: Payer: Self-pay

## 2019-04-27 DIAGNOSIS — I493 Ventricular premature depolarization: Secondary | ICD-10-CM | POA: Diagnosis not present

## 2019-04-27 LAB — EXERCISE TOLERANCE TEST
Estimated workload: 7.5 METS
Exercise duration (min): 6 min
Exercise duration (sec): 21 s
MPHR: 160 {beats}/min
Peak HR: 146 {beats}/min
Percent HR: 91 %
Rest HR: 56 {beats}/min

## 2019-04-27 NOTE — Progress Notes (Signed)
The patient has been notified of the result and verbalized understanding.  All questions (if any) were answered. Jacqulynn Cadet, [place] 04/27/2019 4:37 PM

## 2019-05-02 ENCOUNTER — Encounter: Payer: Self-pay | Admitting: Cardiovascular Disease

## 2019-05-02 ENCOUNTER — Telehealth (INDEPENDENT_AMBULATORY_CARE_PROVIDER_SITE_OTHER): Payer: BC Managed Care – PPO | Admitting: Cardiovascular Disease

## 2019-05-02 DIAGNOSIS — I10 Essential (primary) hypertension: Secondary | ICD-10-CM | POA: Diagnosis not present

## 2019-05-02 DIAGNOSIS — E785 Hyperlipidemia, unspecified: Secondary | ICD-10-CM | POA: Diagnosis not present

## 2019-05-02 DIAGNOSIS — Z7901 Long term (current) use of anticoagulants: Secondary | ICD-10-CM

## 2019-05-02 DIAGNOSIS — I77810 Thoracic aortic ectasia: Secondary | ICD-10-CM | POA: Diagnosis not present

## 2019-05-02 DIAGNOSIS — I480 Paroxysmal atrial fibrillation: Secondary | ICD-10-CM

## 2019-05-02 NOTE — Patient Instructions (Addendum)
Medication Instructions:  Continue same medications If you need a refill on your cardiac medications before your next appointment, please call your pharmacy.   Lab work: None ordered   Testing/Procedures: None ordered  Follow-Up: At [facility], you and your health needs are our priority.  As part of our continuing mission to provide you with exceptional heart care, we have created designated Provider Care Teams.  These Care Teams include your primary Cardiologist (physician) and Advanced Practice Providers (APPs -  Physician Assistants and Nurse Practitioners) who all work together to provide you with the care you need, when you need it.  Follow up appointment scheduled with Dr.Croitoru at office Tue 09/05/19 at 9:00 am.

## 2019-05-02 NOTE — Progress Notes (Signed)
Virtual Visit via Video Note   This visit type was conducted due to national recommendations for restrictions regarding the COVID-19 Pandemic (e.g. social distancing) in an effort to limit this patient's exposure and mitigate transmission in our community.  Due to his co-morbid illnesses, this patient is at least at moderate risk for complications without adequate follow up.  This format is felt to be most appropriate for this patient at this time.  All issues noted in this document were discussed and addressed.  A limited physical exam was performed with this format.  Please refer to the patient's chart for his consent to telehealth for Alexander Reyes.   Date:  05/02/2019   ID:  Alexander Reyes, DOB [birth year], MRN [id number]  Patient Location: Home Provider Location: Office  PCP:  Catha GosselinLittle, Kevin, Reyes  Cardiologist:  Alexander FairMihai Shamieka Gullo, Reyes  Electrophysiologist:  None   Evaluation Performed:  Follow-Up Visit  Chief Complaint:  AFib  History of Present Illness:    Alexander Reyes is a 63 y.o. male with paroxysmal atrial fibrillation, systemic hypertension and mild dilation of the aortic root (sinuses of Valsalva 43 mm).  His episodes of atrial fibrillation recently became more symptomatic and more frequent.  Treatment with flecainide 50 mg twice daily did not seem to have any benefit, but after the dose was increased to 75 mg twice daily he feels complete resolution of the palpitations.  She underwent a treadmill stress test that did not show any evidence of flecainide toxicity or coronary insufficiency.  The patient specifically denies any chest pain at rest exertion, dyspnea at rest or with exertion, orthopnea, paroxysmal nocturnal dyspnea, syncope, palpitations, focal neurological deficits, intermittent claudication, lower extremity edema, unexplained weight gain, cough, hemoptysis or wheezing.  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  The patient does  not have symptoms concerning for COVID-19 infection (fever, chills, cough, or new shortness of breath).    Past Medical History:  Diagnosis Date  . Fibromyalgia   . Hypertension   . Mixed dyslipidemia    Past Surgical History:  Procedure Laterality Date  . FOOT SURGERY     2009  . HERNIA REPAIR     2006     Current Meds  Medication Sig  . amLODipine (NORVASC) 10 MG tablet Take 1 tablet (10 mg total) by mouth daily.  Marland Kitchen. atorvastatin (LIPITOR) 40 MG tablet Take 40 mg by mouth daily.  . flecainide (TAMBOCOR) 50 MG tablet Take 1.5 tablets (75 mg total) by mouth 2 (two) times daily.  Marland Kitchen. lisinopril (PRINIVIL,ZESTRIL) 40 MG tablet Take 40 mg by mouth daily.  . metoprolol succinate (TOPROL-XL) 100 MG 24 hr tablet Take 1 tablet (100 mg total) by mouth daily. Take with or immediately following a meal.  . Omega-3 Fatty Acids (FISH OIL) 1000 MG CAPS Take 1,000 mg by mouth daily.  . rivaroxaban (XARELTO) 20 MG TABS tablet TAKE 1 TABLET BY MOUTH ONCE DAILY WITH SUPPER     Allergies:   Patient has no known allergies.   Social History   Tobacco Use  . Smoking status: Never Smoker  . Smokeless tobacco: Never Used  Substance Use Topics  . Alcohol use: Yes    Alcohol/week: 4.0 standard drinks    Types: 4 Cans of beer per week    Comment: occasional  . Drug use: No     Family Hx: The patient's family history includes Heart attack in his father; Hypertension in his father  and mother; Kidney disease in his brother, father, and sister.  ROS:   Please see the history of present illness.     All other systems reviewed and are negative.   Prior CV studies:   The following studies were reviewed today:  Treadmill stress test April 27, 2019  Labs/Other Tests and Data Reviewed:    EKG:  An ECG dated April 27, 2019 was personally reviewed today and demonstrated:  Sinus rhythm, nonspecific T wave changes especially prominent in the lateral leads.  Recent Labs: 11/24/2018: ALT 42; BUN 14;  Creatinine, Ser 0.99; Potassium 4.2; Sodium 137   Recent Lipid Panel No results found for: CHOL, TRIG, HDL, CHOLHDL, LDLCALC, LDLDIRECT  Wt Readings from Last 3 Encounters:  05/02/19 181 lb (82.1 kg)  03/24/19 182 lb (82.6 kg)  09/08/18 182 lb 9.6 oz (82.8 kg)     Objective:    Vital Signs:  BP 130/90   Pulse 69   Ht 5\' 9"  (1.753 m)   Wt 181 lb (82.1 kg)   BMI 26.73 kg/m    VITAL SIGNS:  reviewed GEN:  no acute distress EYES:  sclerae anicteric, EOMI - Extraocular Movements Intact RESPIRATORY:  normal respiratory effort, symmetric expansion CARDIOVASCULAR:  no peripheral edema SKIN:  no rash, lesions or ulcers. MUSCULOSKELETAL:  no obvious deformities. NEURO:  alert and oriented x 3, no obvious focal deficit PSYCH:  normal affect  ASSESSMENT & PLAN:    1. Parox AFib: Substantial symptomatic improvement on flecainide.  No evidence of toxicity on treadmill stress testing.  Discussed the potential side effects of this medication and monitoring protocol.  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  Discussed the potential for drug interactions and the need to check in with [place] with any new medication prescriptions.  Emergency room injections as well as radiofrequency ablation, should this become necessary.  He really fits the pattern for an excellent ablation response since he has paroxysmal atrial fibrillation, no significant structural heart disease and a left atrium that is at most borderline dilated.  2. Anticoagulation: Xarelto well-tolerated without bleeding complication.  He reports 100% compliance. 3. HTN: Blood pressure is high today, but on a typical day his blood pressure is 125/80. 4. HLP: Labs followed by Alexander Reyes.  Cholesterol has been a little high so he has been trying to improve his diet but may require an increased dose of statin.  Target LDL 100 or less. 5. Mild aortic root dilation: We will monitor with echo for now.  At this point no indication  for CT angiography.  COVID-19 Education: The signs and symptoms of COVID-19 were discussed with the patient and how to seek care for testing (follow up with PCP or arrange E-visit).  The importance of social distancing was discussed today.  Time:   Today, I have spent 14 minutes with the patient with telehealth technology discussing the above problems.     Medication Adjustments/Labs and Tests Ordered: Current medicines are reviewed at length with the patient today.  Concerns regarding medicines are outlined above.   Tests Ordered: No orders of the defined types were placed in this encounter.   Medication Changes: No orders of the defined types were placed in this encounter.   Follow Up:  Virtual Visit or In Person November  Signed, Sanda Klein, Reyes  05/02/2019 8:36 AM    [place]

## 2019-05-04 ENCOUNTER — Other Ambulatory Visit: Payer: Self-pay

## 2019-05-04 MED ORDER — FLECAINIDE ACETATE 50 MG PO TABS: 75.0000 mg | ORAL_TABLET | Freq: Two times a day (BID) | ORAL | 3 refills | Status: DC

## 2019-05-05 MED ORDER — FLECAINIDE ACETATE 50 MG PO TABS: 75.0000 mg | ORAL_TABLET | Freq: Two times a day (BID) | ORAL | 3 refills | Status: DC

## 2019-05-05 NOTE — Addendum Note (Signed)
Addended by: Derl Barrow on: 05/05/2019 02:55 PM   Modules accepted: Orders

## 2019-05-05 NOTE — Telephone Encounter (Signed)
Pt calling stating that his medication flecainide was sent to the wrong pharmacy, pt stated that the medication should have been sent to his mail order pharmacy, [place]. I resent the medication to the pharmacy as requested. Confirmation received.

## 2019-05-26 DIAGNOSIS — H40053 Ocular hypertension, bilateral: Secondary | ICD-10-CM | POA: Diagnosis not present

## 2019-07-07 DIAGNOSIS — E782 Mixed hyperlipidemia: Secondary | ICD-10-CM | POA: Diagnosis not present

## 2019-07-07 DIAGNOSIS — Z79899 Other long term (current) drug therapy: Secondary | ICD-10-CM | POA: Diagnosis not present

## 2019-07-07 DIAGNOSIS — Z8249 Family history of ischemic heart disease and other diseases of the circulatory system: Secondary | ICD-10-CM | POA: Diagnosis not present

## 2019-08-21 ENCOUNTER — Other Ambulatory Visit: Payer: Self-pay | Admitting: Cardiovascular Disease

## 2019-08-21 DIAGNOSIS — H40053 Ocular hypertension, bilateral: Secondary | ICD-10-CM | POA: Diagnosis not present

## 2019-08-21 NOTE — Telephone Encounter (Signed)
Refill request

## 2019-09-05 ENCOUNTER — Other Ambulatory Visit: Payer: Self-pay

## 2019-09-05 ENCOUNTER — Ambulatory Visit: Payer: BC Managed Care – PPO | Admitting: Cardiovascular Disease

## 2019-09-05 ENCOUNTER — Encounter: Payer: Self-pay | Admitting: Cardiovascular Disease

## 2019-09-05 VITALS — BP 152/94 | HR 61 | Temp 97.0°F | Ht 69.0 in | Wt 180.0 lb

## 2019-09-05 DIAGNOSIS — I10 Essential (primary) hypertension: Secondary | ICD-10-CM

## 2019-09-05 DIAGNOSIS — E782 Mixed hyperlipidemia: Secondary | ICD-10-CM | POA: Diagnosis not present

## 2019-09-05 DIAGNOSIS — I77810 Thoracic aortic ectasia: Secondary | ICD-10-CM

## 2019-09-05 DIAGNOSIS — Z7901 Long term (current) use of anticoagulants: Secondary | ICD-10-CM

## 2019-09-05 DIAGNOSIS — I480 Paroxysmal atrial fibrillation: Secondary | ICD-10-CM | POA: Diagnosis not present

## 2019-09-05 NOTE — Progress Notes (Signed)
Patient ID: Alexander ReichertBo Schuman, male   DOB: 1956/06/15, 63 y.o.   MRN: [id number]     Cardiology Office Note    Date:  09/05/2019   ID:  Alexander ReichertBo Kommer, DOB 1956/06/15, MRN [id number] FAA: Levester FreshJerome I. Earlene Plateravis, MD PCP:  Catha GosselinLittle, Kevin, MD  Cardiologist:   Thurmon FairMihai Aman Batley, MD    Chief Complaint  Patient presents with  . Atrial Fibrillation  . Hypertension  . 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   Earlier this year he had increased frequency of palpitations which have had an excellent response to flecainide with virtual abolition of his complaint.   The patient specifically denies any chest pain at rest exertion, dyspnea at rest or with exertion, orthopnea, paroxysmal nocturnal dyspnea, syncope, palpitations, focal neurological deficits, intermittent claudication, lower extremity edema, unexplained weight gain, cough, hemoptysis or wheezing.  He is tolerating Xarelto well and has not had problems with bleeding or excessive bruising.  As on previous occasions, his blood pressure is a little high today although it was improving by the time I examined him.  He checks his blood pressure at least twice a week at home and is consistently in the 125/80 range with a heart rate around 60.  He is not as active as before due to complaints of arthritis.  He tries to walk 20 minutes a day.  His most recent lipid profile showed an LDL cholesterol 122 Dr. Clarene DukeLittle increase his atorvastatin dose.  He remains just mildly overweight and has actually lost a couple of pounds since last year.  He is physically active and very fit.  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   His EKG today again shows lateral T wave inversion.  These have occurred intermittently on most of his ECGs, but they sometimes sometimes normalized.  In 2017 we performed a stress myocardial nuclear perfusion study that showed normal results.  He had a normal treadmill stress test in July 2020.  Similar changes on on ECG were seen as far back as 15-20 years ago  A 24-hour Holter monitor in 2017, after his admission with atrial fibrillation, showed extremely rare PACs and PVCs and was otherwise normal. An exercise myocardial perfusion study was completely normal. EF was 57%. He was able to exercise for 7 minutes and 30 seconds. Echocardiogram also showed normal left ventricular systolic function and there were no valvular abnormalities. Left ventricular walls are borderline hypertrophic. The left atrial diameter was 37 mm, at the upper limit of normal in size. While there was no overt diastolic dysfunction, his tissue Doppler early diastolic velocities were slightly low. Estimated PA pressure was normal.  He has a long-standing history of systemic hypertension dating back at least 20 years. He has strong family history of hypertension on both maternal and paternal sides. 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 The workup was normal. He has never had a myocardial infarction, congestive heart failure, stroke, peripheral embolic events, TIA or other major serious medical problems. He does take a statin for hyperlipidemia. He does not smoke he does not have diabetes mellitus.    Past Medical  History:  Diagnosis Date  . Fibromyalgia   . Hypertension   . Mixed dyslipidemia     Past Surgical History:  Procedure Laterality Date  . FOOT SURGERY     2009  . HERNIA REPAIR     2006    Current Medications: Outpatient Medications Prior to Visit  Medication Sig Dispense Refill  .  atorvastatin (LIPITOR) 40 MG tablet Take 40 mg by mouth daily.    Marland Kitchen diltiazem (CARDIZEM) 30 MG tablet Take 0.5 tablets (15 mg total) by mouth daily as needed (for heart rate greater than 100 for more than 1 hour). 30 tablet 2  . lisinopril (PRINIVIL,ZESTRIL) 40 MG tablet Take 40 mg by mouth daily.    . metoprolol succinate (TOPROL-XL) 100 MG 24 hr tablet Take 1 tablet (100 mg total) by mouth daily. Take with or immediately following a meal. 30 tablet 1  . Omega-3 Fatty Acids (FISH OIL) 1000 MG CAPS Take 1,000 mg by mouth daily.    . rivaroxaban (XARELTO) 20 MG TABS tablet Take 1 tablet (20 mg total) by mouth daily with supper. 90 tablet 1  . amLODipine (NORVASC) 10 MG tablet Take 1 tablet (10 mg total) by mouth daily. 30 tablet 6  .       No facility-administered medications prior to visit.      Allergies:   Patient has no known allergies.   Social History   Socioeconomic History  . Marital status: Single    Spouse name: Not on file  . Number of children: Not on file  . Years of education: Not on file  . Highest education level: Not on file  Occupational History  . Not on file  Social Needs  . Financial resource strain: Not on file  . Food insecurity    Worry: Not on file    Inability: Not on file  . Transportation needs    Medical: Not on file    Non-medical: Not on file  Tobacco Use  . Smoking status: Never Smoker  . Smokeless tobacco: Never Used  Substance and Sexual Activity  . Alcohol use: Yes    Alcohol/week: 4.0 standard drinks    Types: 4 Cans of beer per week    Comment: occasional  . Drug use: No  . Sexual activity: Not on file  Lifestyle  . Physical activity    Days per week: Not on file    Minutes per session: Not on file  . Stress: Not on file  Relationships  . Social Herbalist on phone: Not on file    Gets together: Not on file    Attends religious service: Not on file    Active member of club or organization: Not on file    Attends  meetings of clubs or organizations: Not on file    Relationship status: Not on file  Other Topics Concern  . Not on file  Social History Narrative  . Not on file     Family History:  The patient's family history includes Heart attack in his father; Hypertension in his father and mother; Kidney disease in his brother, father, and sister.   ROS:   Please see the history of present illness.    ROS  All other systems are reviewed and are negative.   PHYSICAL EXAM:   VS:  BP (!) 152/94   Pulse 61   Temp (!) 97 F (36.1 C)   Ht 5\' 9"  (1.753 m)   Wt 180  lb (81.6 kg)   SpO2 99%   BMI 26.58 kg/m     Recheck BP 150/89 General: Alert, oriented x3, no distress, mildly overweight Head: no evidence of trauma, PERRL, EOMI, no exophtalmos or lid lag, no myxedema, no xanthelasma; normal ears, nose and oropharynx Neck: normal jugular venous pulsations and no hepatojugular reflux; brisk carotid pulses without delay and no carotid bruits Chest: clear to auscultation, no signs of consolidation by percussion or palpation, normal fremitus, symmetrical and full respiratory excursions Cardiovascular: normal position and quality of the apical impulse, regular rhythm, normal first and second heart sounds, no murmurs, rubs or gallops Abdomen: no tenderness or distention, no masses by palpation, no abnormal pulsatility or arterial bruits, normal bowel sounds, no hepatosplenomegaly Extremities: no clubbing, cyanosis or edema; 2+ radial, ulnar and brachial pulses bilaterally; 2+ right femoral, posterior tibial and dorsalis pedis pulses; 2+ left femoral, posterior tibial and dorsalis pedis pulses; no subclavian or femoral bruits Neurological: grossly nonfocal Psych: Normal mood and affect     Wt Readings from Last 3 Encounters:  09/05/19 180 lb (81.6 kg)  05/02/19 181 lb (82.1 kg)  03/24/19 182 lb (82.6 kg)      Studies/Labs Reviewed:   EKG:  EKG is ordered today.  His electrocardiogram shows  sinus bradycardia, minor QRS prolongation at 108 ms, T wave inversion in leads V4-V6, similar to last year's tracing. Recent Labs: 11/24/2018: ALT 42; BUN 14; Creatinine, Ser 0.99; Potassium 4.2; Sodium 137  Usually checked by his primary care provider every January  ASSESSMENT:    1. Paroxysmal atrial fibrillation (HCC)   2. Long term current use of anticoagulant   3. Essential hypertension   4. Mixed hyperlipidemia   5. Dilated aortic root (HCC)      PLAN:  In order of problems listed above:   1. AFib: Excellent response to flecainide.  Continues on beta-blockers as well.  Symptoms worsened during respiratory infection.  CHA2DS2-VASc Score is 1 (HTN), corresponding to 1 unadjusted Ischemic Stroke Rate of 0.6 % stroke rate/year.  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  If he fails flecainide therapy, I think she will be an excellent candidate for radiofrequency ablation. 2.  Xarelto: Well-tolerated, no bleeding complications.  3. HTN: Blood pressure slightly elevated today, but at home he has good blood pressure control, typically 120s/80 range. 4. HLP: He does not have known coronary or peripheral vascular disease.  Target LDL less than 100.  Dose of atorvastatin recently increased with plan for recheck of his lipid profile next month with Dr. Clarene Duke. 5. Ao dilation: Asymptomatic and stable.  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  This should include his current clinical status report, the results of the 24-hour Holter, list of medications, anticoagulant regimen tolerability, CHADSVasc core, status of aortic root dilation.  Medication Adjustments/Labs and Tests Ordered: Current  medicines are reviewed at length with the patient today.  Concerns regarding medicines are outlined above.  Medication changes, Labs and Tests ordered today are listed in the Patient Instructions below. Patient Instructions  Medication Instructions:  No changes *If you need a refill on your cardiac medications before your next appointment, please call  your pharmacy*  Lab Work: None ordered If you have labs (blood work) drawn today and your tests are completely normal, you will receive your results only by: Marland Kitchen MyChart Message (if you have MyChart) OR . A paper copy in the mail If you have any lab test that is abnormal or we need to change your treatment, we will call you to review the results.  Testing/Procedures: Your physician has recommended that you wear a 24 hour holter monitor. Holter monitors are medical devices that record the heart's electrical activity. Doctors most often use these monitors to diagnose arrhythmias. Arrhythmias are problems with the speed or rhythm of the heartbeat. The monitor is a small, portable device. You can wear one while you do your normal daily activities. This is usually used to diagnose what is causing palpitations/syncope (passing out).  Follow-Up: At [facility], you and your health needs are our priority.  As part of our continuing mission to provide you with exceptional heart care, we have created designated Provider Care Teams.  These Care Teams include your primary Cardiologist (physician) and Advanced Practice Providers (APPs -  Physician Assistants and Nurse Practitioners) who all work together to provide you with the care you need, when you need it.  Your next appointment:   12 months  The format for your next appointment:   In Person  Provider:   Thurmon Fair, MD      Signed, Thurmon Fair, MD  09/05/2019 9:15 AM    [facility] [address] [place], [place], [place]  [address] Phone: [phone]; Fax: [phone]

## 2019-09-05 NOTE — Patient Instructions (Addendum)
Medication Instructions:  No changes *If you need a refill on your cardiac medications before your next appointment, please call your pharmacy*  Lab Work: None ordered If you have labs (blood work) drawn today and your tests are completely normal, you will receive your results only by: Marland Kitchen MyChart Message (if you have MyChart) OR . A paper copy in the mail If you have any lab test that is abnormal or we need to change your treatment, we will call you to review the results.  Testing/Procedures: Your physician has recommended that you wear a 24 hour holter monitor. Holter monitors are medical devices that record the heart's electrical activity. Doctors most often use these monitors to diagnose arrhythmias. Arrhythmias are problems with the speed or rhythm of the heartbeat. The monitor is a small, portable device. You can wear one while you do your normal daily activities. This is usually used to diagnose what is causing palpitations/syncope (passing out).  Follow-Up: At [facility], you and your health needs are our priority.  As part of our continuing mission to provide you with exceptional heart care, we have created designated Provider Care Teams.  These Care Teams include your primary Cardiologist (physician) and Advanced Practice Providers (APPs -  Physician Assistants and Nurse Practitioners) who all work together to provide you with the care you need, when you need it.  Your next appointment:   12 months  The format for your next appointment:   In Person  Provider:   Sanda Klein, MD

## 2019-09-15 ENCOUNTER — Telehealth: Payer: Self-pay

## 2019-09-15 NOTE — Telephone Encounter (Signed)
3 day ZIO ordered and mailed to pt. We are currently not doing Holter monitors in the office so the order was switched to [place] monitor.

## 2019-10-04 ENCOUNTER — Ambulatory Visit (INDEPENDENT_AMBULATORY_CARE_PROVIDER_SITE_OTHER): Payer: BC Managed Care – PPO

## 2019-10-04 DIAGNOSIS — I480 Paroxysmal atrial fibrillation: Secondary | ICD-10-CM | POA: Diagnosis not present

## 2019-10-16 ENCOUNTER — Telehealth: Payer: Self-pay | Admitting: Cardiovascular Disease

## 2019-10-16 NOTE — Telephone Encounter (Signed)
Pt calling for monitor results. Informed pt that per chart review, results was uploaded today and once MD review them his nurse will call. Pt verbalized understanding.

## 2019-10-16 NOTE — Telephone Encounter (Signed)
Patient calling for monitor results. 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

## 2019-10-18 ENCOUNTER — Telehealth: Payer: Self-pay | Admitting: *Deleted

## 2019-10-18 ENCOUNTER — Encounter: Payer: Self-pay | Admitting: Cardiovascular Disease

## 2019-10-18 ENCOUNTER — Encounter: Payer: Self-pay | Admitting: *Deleted

## 2019-10-18 NOTE — Telephone Encounter (Signed)
Left a message for the patient to call back.  

## 2019-10-18 NOTE — Telephone Encounter (Signed)
-----   Message from Sanda Klein, MD sent at 10/17/2019  5:34 PM EST ----- Normal monitor. 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

## 2019-10-18 NOTE — Telephone Encounter (Signed)
Patient made aware of results and verbalized understanding.  

## 2019-10-24 ENCOUNTER — Encounter: Payer: Self-pay | Admitting: *Deleted

## 2020-01-01 DIAGNOSIS — H40053 Ocular hypertension, bilateral: Secondary | ICD-10-CM | POA: Diagnosis not present

## 2020-01-09 ENCOUNTER — Telehealth: Payer: Self-pay | Admitting: *Deleted

## 2020-01-09 DIAGNOSIS — I480 Paroxysmal atrial fibrillation: Secondary | ICD-10-CM

## 2020-01-09 NOTE — Telephone Encounter (Signed)
Call placed to the patient. 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 This has been ordered and will be mailed to him. Once the results are in, the paperwork can be finished. He has verbalized his understanding.

## 2020-01-11 ENCOUNTER — Ambulatory Visit (INDEPENDENT_AMBULATORY_CARE_PROVIDER_SITE_OTHER): Payer: BC Managed Care – PPO

## 2020-01-11 ENCOUNTER — Other Ambulatory Visit: Payer: Self-pay

## 2020-01-11 DIAGNOSIS — I480 Paroxysmal atrial fibrillation: Secondary | ICD-10-CM | POA: Diagnosis not present

## 2020-01-12 ENCOUNTER — Ambulatory Visit: Payer: BC Managed Care – PPO | Attending: Internal Medicine

## 2020-01-12 DIAGNOSIS — Z23 Encounter for immunization: Secondary | ICD-10-CM

## 2020-01-12 NOTE — Progress Notes (Signed)
   Covid-19 Vaccination Clinic  Name:  Alexander Reyes    MRN: [id number] DOB: January 25, 1956  01/12/2020  Alexander Reyes was observed post Covid-19 immunization for 15 minutes without incident. He was provided with Vaccine Information Sheet and instruction to access the V-Safe system.   Alexander Reyes was instructed to call 911 with any severe reactions post vaccine: Marland Kitchen Difficulty breathing  . Swelling of face and throat  . A fast heartbeat  . A bad rash all over body  . Dizziness and weakness   Immunizations Administered    Name Date Dose VIS Date Route   Moderna COVID-19 Vaccine 01/12/2020 10:17 AM 0.5 mL 09/26/2019 Intramuscular   Manufacturer: Moderna   Lot: [id number]   NDC: 10626-948-54

## 2020-02-13 ENCOUNTER — Ambulatory Visit: Payer: BC Managed Care – PPO | Attending: Internal Medicine

## 2020-02-13 DIAGNOSIS — Z23 Encounter for immunization: Secondary | ICD-10-CM

## 2020-02-13 NOTE — Progress Notes (Signed)
   Covid-19 Vaccination Clinic  Name:  Alexander Reyes    MRN: [id number] DOB: 1956/02/12  02/13/2020  [name] was observed post Covid-19 immunization for 15 minutes without incident. He was provided with Vaccine Information Sheet and instruction to access the V-Safe system.   [name] was instructed to call 911 with any severe reactions post vaccine: Marland Kitchen Difficulty breathing  . Swelling of face and throat  . A fast heartbeat  . A bad rash all over body  . Dizziness and weakness   Immunizations Administered    Name Date Dose VIS Date Route   Moderna COVID-19 Vaccine 02/13/2020  9:17 AM 0.5 mL 09/2019 Intramuscular   Manufacturer: Moderna   Lot: [id number]   NDC: 52481-859-09

## 2020-03-08 DIAGNOSIS — M545 Low back pain: Secondary | ICD-10-CM | POA: Diagnosis not present

## 2020-03-25 DIAGNOSIS — M545 Low back pain: Secondary | ICD-10-CM | POA: Diagnosis not present

## 2020-03-31 ENCOUNTER — Other Ambulatory Visit: Payer: Self-pay | Admitting: Cardiovascular Disease

## 2020-04-01 NOTE — Telephone Encounter (Signed)
Rx request sent to pharmacy.  

## 2020-05-14 ENCOUNTER — Telehealth: Payer: Self-pay | Admitting: *Deleted

## 2020-05-14 NOTE — Telephone Encounter (Signed)
Xarelto PA faxed to [phone]

## 2020-05-15 NOTE — Telephone Encounter (Signed)
Xarelto has been approved through 05/14/2021

## 2020-08-20 ENCOUNTER — Telehealth: Payer: Self-pay

## 2020-08-20 NOTE — Telephone Encounter (Signed)
° °  [facility] Pre-operative Risk Assessment    HEARTCARE STAFF: - Please ensure there is not already an duplicate clearance open for this procedure. - Under Visit Info/Reason for Call, type in Other and utilize the format Clearance MM/DD/YY or Clearance TBD. Do not use dashes or single digits. - If request is for dental extraction, please clarify the # of teeth to be extracted.  Request for surgical clearance:  1. What type of surgery is being performed? Colonoscopy    2. When is this surgery scheduled? 09/30/20   3. What type of clearance is required (medical clearance vs. Pharmacy clearance to hold med vs. Both)? Both  4. Are there any medications that need to be held prior to surgery and how long? Xarelto    5. Practice name and name of physician performing surgery? [facility], Dr. Michail Sermon  6. What is the office phone number? [phone]   7.   What is the office fax number? [phone]  8.   Anesthesia type (None, local, MAC, general) ? Not listed    Jacqulynn Cadet 08/20/2020, 8:06 AM  _________________________________________________________________   (provider comments below)

## 2020-08-20 NOTE — Telephone Encounter (Signed)
   Primary Cardiologist: Thurmon Fair, MD  Patient needing cardiac pre-op evaluation for colonoscopy scheduled for 09/30/20. GI office is requesting medical clearance and pharmacological clearance for Xarelto. Patient has an upcoming appointment with Dr. Royann Shivers 09/11/20 at which time pre-op assessment can be performed.   [name] David Stall, PA-C 08/20/2020, 9:03 AM

## 2020-09-11 ENCOUNTER — Encounter: Payer: Self-pay | Admitting: Cardiovascular Disease

## 2020-09-11 ENCOUNTER — Other Ambulatory Visit: Payer: Self-pay

## 2020-09-11 ENCOUNTER — Ambulatory Visit: Payer: BC Managed Care – PPO | Admitting: Cardiovascular Disease

## 2020-09-11 VITALS — BP 127/80 | HR 55 | Ht 69.0 in | Wt 180.0 lb

## 2020-09-11 DIAGNOSIS — I10 Essential (primary) hypertension: Secondary | ICD-10-CM

## 2020-09-11 DIAGNOSIS — E7800 Pure hypercholesterolemia, unspecified: Secondary | ICD-10-CM

## 2020-09-11 DIAGNOSIS — I480 Paroxysmal atrial fibrillation: Secondary | ICD-10-CM

## 2020-09-11 DIAGNOSIS — Z79899 Other long term (current) drug therapy: Secondary | ICD-10-CM

## 2020-09-11 DIAGNOSIS — Z5181 Encounter for therapeutic drug level monitoring: Secondary | ICD-10-CM

## 2020-09-11 DIAGNOSIS — I77810 Thoracic aortic ectasia: Secondary | ICD-10-CM

## 2020-09-11 DIAGNOSIS — Z7901 Long term (current) use of anticoagulants: Secondary | ICD-10-CM

## 2020-09-11 NOTE — Progress Notes (Signed)
Patient ID: Alexander Reyes, male   DOB: 1955/11/06, 64 y.o.   MRN: [id number]     Cardiology Office Note    Date:  09/11/2020   ID:  Alexander Reyes, DOB 1955/11/06, MRN [id number] FAA: Levester FreshJerome I. Earlene Plateravis, MD PCP:  Catha GosselinLittle, Kevin, MD  Cardiologist:   Thurmon FairMihai Shellie Rogoff, MD    Chief Complaint  Patient presents with  . 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   Is doing great from a cardiovascular point of view with no complaints.  He has not had palpitations this year.  He has never experienced syncope.  He denies angina or dyspnea either at rest or with activity and does not have orthopnea, PND, focal neurological complaints, intermittent claudication, lower extremity edema, cough hemoptysis or wheezing.  He has not had any falls, injuries or bleeding problems and is compliant with anticoagulation.  His typical blood pressure at home is in the 120/80 range.  His heart rate is almost always in the high 50s.  His most recent lipid profile showed improvement with an LDL cholesterol of 68.  He is compliant with statin therapy.  He is physically active and fit.  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 FAA requests an updated 24-hour Holter monitor (they no longer require an echocardiogram).  The Holter monitor will be due next spring.  She has a chronically abnormal ECG that often shows T wave inversion in the lateral precordial leads, similar changes being seen on his EKG as far back as 15-20 years ago, although surprisingly they sometimes resolve.  The changes are probably related to a minor intraventricular conduction delay (incomplete left bundle branch block).  This predates treatment with flecainide.  He had a normal nuclear stress  test in 2017. Normal exercise treadmill stress test July 2020.  A 24-hour Holter monitor in 2017, after his admission with atrial fibrillation, showed extremely rare PACs and PVCs and was otherwise normal. An exercise myocardial perfusion study was completely normal. EF was 57%. He was able to exercise for 7 minutes and 30 seconds. Echocardiogram also showed normal left ventricular systolic function and there were no valvular abnormalities. Left ventricular walls are borderline hypertrophic. The left atrial diameter was 37 mm, at the upper limit of normal in size. While there was no overt diastolic dysfunction, his tissue Doppler early diastolic velocities were slightly low. Estimated PA pressure was normal.  He has a long-standing history of systemic hypertension dating back at least 20 years. He has strong family history of hypertension on both maternal and paternal sides. 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 The workup was normal. He has never had a myocardial infarction, congestive heart failure, stroke, peripheral embolic events, TIA or other major serious medical problems. He does take a statin for hyperlipidemia. He does not smoke he does not have diabetes mellitus.    Past Medical History:  Diagnosis Date  . Fibromyalgia   . Hypertension   . Mixed dyslipidemia     Past Surgical History:  Procedure Laterality Date  . FOOT SURGERY     2009  . HERNIA REPAIR     2006    Current Medications: Outpatient Medications Prior to Visit  Medication  Sig Dispense Refill  . atorvastatin (LIPITOR) 40 MG tablet Take 40 mg by mouth daily.    Marland Kitchen diltiazem (CARDIZEM) 30 MG tablet Take 0.5 tablets (15 mg total) by mouth daily as needed (for heart rate greater than 100 for more than 1 hour). 30 tablet 2  . lisinopril (PRINIVIL,ZESTRIL) 40 MG tablet Take 40 mg by mouth daily.    . metoprolol succinate  (TOPROL-XL) 100 MG 24 hr tablet Take 1 tablet (100 mg total) by mouth daily. Take with or immediately following a meal. 30 tablet 1  . Omega-3 Fatty Acids (FISH OIL) 1000 MG CAPS Take 1,000 mg by mouth daily.    . rivaroxaban (XARELTO) 20 MG TABS tablet Take 1 tablet (20 mg total) by mouth daily with supper. 90 tablet 1  . amLODipine (NORVASC) 10 MG tablet Take 1 tablet (10 mg total) by mouth daily. 30 tablet 6  .       No facility-administered medications prior to visit.      Allergies:   Patient has no known allergies.   Social History   Socioeconomic History  . Marital status: Single    Spouse name: Not on file  . Number of children: Not on file  . Years of education: Not on file  . Highest education level: Not on file  Occupational History  . Not on file  Tobacco Use  . Smoking status: Never Smoker  . Smokeless tobacco: Never Used  Substance and Sexual Activity  . Alcohol use: Yes    Alcohol/week: 4.0 standard drinks    Types: 4 Cans of beer per week    Comment: occasional  . Drug use: No  . Sexual activity: Not on file  Other Topics Concern  . Not on file  Social History Narrative  . Not on file   Social Determinants of Health   Financial Resource Strain:   . Difficulty of Paying Living Expenses: Not on file  Food Insecurity:   . Worried About Programme researcher, broadcasting/film/video in the Last Year: Not on file  . Ran Out of Food in the Last Year: Not on file  Transportation Needs:   . Lack of Transportation (Medical): Not on file  . Lack of Transportation (Non-Medical): Not on file  Physical Activity:   . Days of Exercise per Week: Not on file  . Minutes of Exercise per Session: Not on file  Stress:   . Feeling of Stress : Not on file  Social Connections:   . Frequency of Communication with Friends and Family: Not on file  . Frequency of Social Gatherings with Friends and Family: Not on file  . Attends Religious Services: Not on file  . Active Member of Clubs or  Organizations: Not on file  . Attends Banker Meetings: Not on file  . Marital Status: Not on file     Family History:  The patient's family history includes Heart attack in his father; Hypertension in his father and mother; Kidney disease in his brother, father, and sister.   ROS:   Please see the history of present illness.    ROS  All other systems are reviewed and are negative.   PHYSICAL EXAM:   VS:  BP 127/80   Pulse (!) 55   Ht 5\' 9"  (1.753 m)   Wt 180 lb (81.6 kg)   SpO2 98%   BMI 26.58 kg/m      General: Alert, oriented x3, no distress, appears fit Head: no evidence of  trauma, PERRL, EOMI, no exophtalmos or lid lag, no myxedema, no xanthelasma; normal ears, nose and oropharynx Neck: normal jugular venous pulsations and no hepatojugular reflux; brisk carotid pulses without delay and no carotid bruits Chest: clear to auscultation, no signs of consolidation by percussion or palpation, normal fremitus, symmetrical and full respiratory excursions Cardiovascular: normal position and quality of the apical impulse, regular rhythm, normal first and second heart sounds, no murmurs, rubs or gallops Abdomen: no tenderness or distention, no masses by palpation, no abnormal pulsatility or arterial bruits, normal bowel sounds, no hepatosplenomegaly Extremities: no clubbing, cyanosis or edema; 2+ radial, ulnar and brachial pulses bilaterally; 2+ right femoral, posterior tibial and dorsalis pedis pulses; 2+ left femoral, posterior tibial and dorsalis pedis pulses; no subclavian or femoral bruits Neurological: grossly nonfocal Psych: Normal mood and affect  Wt Readings from Last 3 Encounters:  09/11/20 180 lb (81.6 kg)  09/05/19 180 lb (81.6 kg)  05/02/19 181 lb (82.1 kg)    Studies/Labs Reviewed:   EKG:  EKG is ordered today.  There is mild sinus bradycardia, minor QRS prolongation with a QRS duration of 106 ms (incomplete left bundle branch block), mild ST segment  depression in prominent T wave inversion in leads V3-V6, normal QTC 413 ms. No results found for requested labs within last 8760 hours.   07/04/2020 Total cholesterol 131, HDL 35, LDL 68, triglycerides 116 Hemoglobin 14.3, creatinine 0.8, potassium 4.5, normal liver function test, TSH 1.1  ASSESSMENT:    1. Paroxysmal atrial fibrillation (HCC)   2. Encounter for monitoring flecainide therapy   3. Essential hypertension   4. Hypercholesterolemia   5. Aortic root dilation (HCC)   6. Long term current use of anticoagulant      PLAN:  In order of problems listed above:   1. AFib: Excellent clinical response to flecainide and beta-blockers.   CHA2DS2-VASc Score is 1 (HTN), corresponding to 1 unadjusted Ischemic Stroke Rate of 0.6 % stroke rate/year.  Reviewed 24-hour Holter monitor with at least 2 leads per FAA protocol.  If he fails flecainide therapy, I think she will be an excellent candidate for radiofrequency ablation. 2. Flecainide: Has minor intraventricular conduction delay that predates treatment with flecainide.  We will perform a treadmill stress test before his office appointment next year.  No signs or symptoms of CAD to date. 3. HTN: Excellent blood pressure control 4. HLP: He does not have known coronary or peripheral vascular disease.  LDL in target range of 68 by recent labs.  On statin. 5. Ao dilation: Asymptomatic and stable.  Needs a CT angiogram for follow-up in the near future. 6. Anticoagulation:  Xarelto. No bleeding complications   When the echocardiogram and Holter monitor are available he will need a letter for the FAA.  This should include his current clinical status report, the results of the 24-hour Holter, list of medications, anticoagulant regimen tolerability, CHADSVasc core, status of aortic root dilation.  Medication Adjustments/Labs and Tests Ordered: Current medicines are reviewed at length with the patient today.  Concerns regarding medicines are  outlined above.  Medication changes, Labs and Tests ordered today are listed in the Patient Instructions below. Patient Instructions  Medication Instructions:  No changes *If you need a refill on your cardiac medications before your next appointment, please call your pharmacy*   Lab Work: None ordered If you have labs (blood work) drawn today and your tests are completely normal, you will receive your results only by: Marland Kitchen MyChart Message (if you have MyChart)  OR . A paper copy in the mail If you have any lab test that is abnormal or we need to change your treatment, we will call you to review the results.   Testing/Procedures: Your physician has requested that you have an exercise tolerance test before your follow up in November 2022. For further information please visit [email or web address] Please also follow instruction sheet, as given. This will take place at [address], [address].  Do not drink or eat foods with caffeine for 24 hours before the test. (Chocolate, coffee, tea, or energy drinks)  If you use an inhaler, bring it with you to the test.  Do not smoke for 4 hours before the test.  Wear comfortable shoes and clothing.  Your physician has recommended that you wear a 24 hour holter monitor in May 2022. Holter monitors are medical devices that record the heart's electrical activity. Doctors most often use these monitors to diagnose arrhythmias. Arrhythmias are problems with the speed or rhythm of the heartbeat. The monitor is a small, portable device. You can wear one while you do your normal daily activities. This is usually used to diagnose what is causing palpitations/syncope (passing out).   Follow-Up: At [facility], you and your health needs are our priority.  As part of our continuing mission to provide you with exceptional heart care, we have created designated Provider Care Teams.  These Care Teams include your primary Cardiologist (physician) and Advanced  Practice Providers (APPs -  Physician Assistants and Nurse Practitioners) who all work together to provide you with the care you need, when you need it.  We recommend signing up for the patient portal called "MyChart".  Sign up information is provided on this After Visit Summary.  MyChart is used to connect with patients for Virtual Visits (Telemedicine).  Patients are able to view lab/test results, encounter notes, upcoming appointments, etc.  Non-urgent messages can be sent to your provider as well.   To learn more about what you can do with MyChart, go to [email or web address].    Your next appointment:   12 month(s)  The format for your next appointment:   In Person  Provider:   You may see Thurmon Fair, MD or one of the following Advanced Practice Providers on your designated Care Team:    Azalee Course, PA-C  Micah Flesher, [place] or   Judy Pimple, PA-C       Signed, Thurmon Fair, MD  09/11/2020 11:25 AM    [facility] [address] [place], [place], [place]  [address] Phone: [phone]; Fax: [phone]

## 2020-09-11 NOTE — Patient Instructions (Signed)
Medication Instructions:  No changes *If you need a refill on your cardiac medications before your next appointment, please call your pharmacy*   Lab Work: None ordered If you have labs (blood work) drawn today and your tests are completely normal, you will receive your results only by: Marland Kitchen MyChart Message (if you have MyChart) OR . A paper copy in the mail If you have any lab test that is abnormal or we need to change your treatment, we will call you to review the results.   Testing/Procedures: Your physician has requested that you have an exercise tolerance test before your follow up in November 2022. For further information please visit [email or web address] Please also follow instruction sheet, as given. This will take place at [address], [address].  Do not drink or eat foods with caffeine for 24 hours before the test. (Chocolate, coffee, tea, or energy drinks)  If you use an inhaler, bring it with you to the test.  Do not smoke for 4 hours before the test.  Wear comfortable shoes and clothing.  Your physician has recommended that you wear a 24 hour holter monitor in May 2022. Holter monitors are medical devices that record the heart's electrical activity. Doctors most often use these monitors to diagnose arrhythmias. Arrhythmias are problems with the speed or rhythm of the heartbeat. The monitor is a small, portable device. You can wear one while you do your normal daily activities. This is usually used to diagnose what is causing palpitations/syncope (passing out).   Follow-Up: At [facility], you and your health needs are our priority.  As part of our continuing mission to provide you with exceptional heart care, we have created designated Provider Care Teams.  These Care Teams include your primary Cardiologist (physician) and Advanced Practice Providers (APPs -  Physician Assistants and Nurse Practitioners) who all work together to provide you with the care you need, when  you need it.  We recommend signing up for the patient portal called "MyChart".  Sign up information is provided on this After Visit Summary.  MyChart is used to connect with patients for Virtual Visits (Telemedicine).  Patients are able to view lab/test results, encounter notes, upcoming appointments, etc.  Non-urgent messages can be sent to your provider as well.   To learn more about what you can do with MyChart, go to [email or web address].    Your next appointment:   12 month(s)  The format for your next appointment:   In Person  Provider:   You may see Thurmon Fair, MD or one of the following Advanced Practice Providers on your designated Care Team:    Azalee Course, PA-C  Micah Flesher, PA-C or   Judy Pimple, [place]

## 2020-09-13 ENCOUNTER — Encounter: Payer: Self-pay | Admitting: Cardiovascular Disease

## 2020-09-13 NOTE — Telephone Encounter (Signed)
Letter sent via Epic to Dr. Bosie Clos

## 2020-09-13 NOTE — Telephone Encounter (Signed)
Patient calling to check on the status of this clearance, advised that it is being reviewed currently. Patient verbalized understanding.

## 2020-09-13 NOTE — Telephone Encounter (Signed)
Pt informed of providers result & recommendations. Pt verbalized understanding. No further questions . He will call Dr. Marge Duncans office and discuss clearance.

## 2020-10-03 ENCOUNTER — Other Ambulatory Visit: Payer: Self-pay | Admitting: Cardiovascular Disease

## 2020-11-25 ENCOUNTER — Other Ambulatory Visit: Payer: Self-pay | Admitting: Cardiovascular Disease

## 2021-02-21 ENCOUNTER — Telehealth: Payer: Self-pay | Admitting: *Deleted

## 2021-02-21 DIAGNOSIS — I480 Paroxysmal atrial fibrillation: Secondary | ICD-10-CM

## 2021-02-21 NOTE — Telephone Encounter (Signed)
Scheduled to come to [facility] office, Monday, 02/24/21,to have [place] Multi-Lead Holter monitor applied for FAA clearance.

## 2021-02-25 ENCOUNTER — Ambulatory Visit (INDEPENDENT_AMBULATORY_CARE_PROVIDER_SITE_OTHER): Payer: BC Managed Care – PPO

## 2021-02-25 ENCOUNTER — Other Ambulatory Visit: Payer: Self-pay

## 2021-02-25 DIAGNOSIS — I480 Paroxysmal atrial fibrillation: Secondary | ICD-10-CM

## 2021-03-17 ENCOUNTER — Encounter: Payer: Self-pay | Admitting: Cardiovascular Disease

## 2021-08-27 ENCOUNTER — Other Ambulatory Visit: Payer: Self-pay | Admitting: Cardiovascular Disease

## 2021-08-27 NOTE — Telephone Encounter (Signed)
Prescription refill request for Xarelto received.  Indication:A Fib Last office visit:11/21 Weight:81.6 kg Age:65 Scr:0.8 CrCl:109.08 ml/min  Prescription refilled

## 2021-09-04 ENCOUNTER — Telehealth: Payer: Self-pay | Admitting: *Deleted

## 2021-09-04 NOTE — Telephone Encounter (Signed)
Xarelto PA has been faxed back to [phone] to [facility]

## 2021-09-12 ENCOUNTER — Encounter: Payer: Self-pay | Admitting: Cardiovascular Disease

## 2021-10-03 ENCOUNTER — Other Ambulatory Visit: Payer: Self-pay

## 2021-10-03 ENCOUNTER — Ambulatory Visit: Payer: BC Managed Care – PPO | Admitting: Cardiovascular Disease

## 2021-10-03 ENCOUNTER — Encounter: Payer: Self-pay | Admitting: Cardiovascular Disease

## 2021-10-03 VITALS — BP 112/74 | HR 61 | Ht 69.0 in | Wt 187.8 lb

## 2021-10-03 DIAGNOSIS — I77810 Thoracic aortic ectasia: Secondary | ICD-10-CM

## 2021-10-03 DIAGNOSIS — Z5181 Encounter for therapeutic drug level monitoring: Secondary | ICD-10-CM | POA: Diagnosis not present

## 2021-10-03 DIAGNOSIS — Z79899 Other long term (current) drug therapy: Secondary | ICD-10-CM

## 2021-10-03 DIAGNOSIS — E785 Hyperlipidemia, unspecified: Secondary | ICD-10-CM | POA: Diagnosis not present

## 2021-10-03 DIAGNOSIS — I480 Paroxysmal atrial fibrillation: Secondary | ICD-10-CM

## 2021-10-03 DIAGNOSIS — Z7901 Long term (current) use of anticoagulants: Secondary | ICD-10-CM

## 2021-10-03 DIAGNOSIS — I10 Essential (primary) hypertension: Secondary | ICD-10-CM | POA: Diagnosis not present

## 2021-10-03 NOTE — Patient Instructions (Signed)
Medication Instructions:  No changes *If you need a refill on your cardiac medications before your next appointment, please call your pharmacy*   Lab Work: None ordered If you have labs (blood work) drawn today and your tests are completely normal, you will receive your results only by: MyChart Message (if you have MyChart) OR A paper copy in the mail If you have any lab test that is abnormal or we need to change your treatment, we will call you to review the results.   Testing/Procedures: None ordered  We will place an order for a 48 hour monitor in May 2023.   Follow-Up: At [facility], you and your health needs are our priority.  As part of our continuing mission to provide you with exceptional heart care, we have created designated Provider Care Teams.  These Care Teams include your primary Cardiologist (physician) and Advanced Practice Providers (APPs -  Physician Assistants and Nurse Practitioners) who all work together to provide you with the care you need, when you need it.  We recommend signing up for the patient portal called "MyChart".  Sign up information is provided on this After Visit Summary.  MyChart is used to connect with patients for Virtual Visits (Telemedicine).  Patients are able to view lab/test results, encounter notes, upcoming appointments, etc.  Non-urgent messages can be sent to your provider as well.   To learn more about what you can do with MyChart, go to [email or web address].    Your next appointment:   12 month(s)  The format for your next appointment:   In Person  Provider:   Thurmon Fair, MD

## 2021-10-04 NOTE — Progress Notes (Signed)
Patient ID: Alexander Reyes, male   DOB: 05-Dec-1955, 65 y.o.   MRN: [id number]     Cardiology Office Note    Date:  10/04/2021   ID:  Alexander Reyes, DOB [birth year], MRN [id number] FAA: Levester Fresh. 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   Cardiovascular point of view he is doing quite well and is asymptomatic.  Its been a couple of years since he has had any palpitations suggestive of atrial fibrillation.  He is compliant with anticoagulation and has not had any bleeding problems.  The patient specifically denies any chest pain at rest or with exertion, dyspnea at rest or with exertion, orthopnea, paroxysmal nocturnal dyspnea, syncope, palpitations, focal neurological deficits, intermittent claudication, lower extremity edema, unexplained weight gain, cough, hemoptysis or wheezing.  Blood pressure is consistently well controlled and his heart rate is usually around 55 to 60 bpm at rest.  His most recent lipid profile shows an LDL of 91 but his HDL remains low at 30.  He does not have 90 known CAD or PAD.  He is physically active and fit.  He works as a Passenger transport manager for [employer]. FAA requests an updated 48-hour Holter monitor with a minimum of 2-lead monitoring (they no longer require an echocardiogram).  The Holter monitor will be due in May or June of next year.  As before, his electrocardiogram shows T wave inversion in the lateral precordial leads which have been present as far back as 20 years ago, although interestingly they sometimes resolved.  They seem to correlate to a minor intraventricular conduction  delay (QRS 102 ms) that predates treatment with flecainide.  He had a normal nuclear stress test in 2017 and a normal plain treadmill stress test in 2020.  A 24-hour Holter monitor in 2017, after his admission with atrial fibrillation, showed extremely rare PACs and PVCs and was otherwise normal. An exercise myocardial perfusion study was completely normal. EF was 57%. He was able to exercise for 7 minutes and 30 seconds. Echocardiogram also showed normal left ventricular systolic function and there were no valvular abnormalities. Left ventricular walls are borderline hypertrophic. The left atrial diameter was 37 mm, at the upper limit of normal in size. While there was no overt diastolic dysfunction, his tissue Doppler early diastolic velocities were slightly low. Estimated PA pressure was normal.  He has a long-standing history of systemic hypertension dating back at least 20 years. He has strong family history of hypertension on both maternal and paternal sides. 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 The workup was normal. He has never had a myocardial infarction, congestive heart failure, stroke, peripheral embolic events, TIA or other major serious medical problems. He does take a statin for hyperlipidemia. He does not smoke he does not have diabetes mellitus.    Past Medical History:  Diagnosis Date   Fibromyalgia    Hypertension    Mixed dyslipidemia     Past Surgical History:  Procedure Laterality Date   FOOT SURGERY     2009   HERNIA REPAIR  2006    Current Medications: Outpatient Medications Prior to Visit  Medication Sig Dispense Refill   atorvastatin (LIPITOR) 40 MG tablet Take 40 mg by mouth daily.     diltiazem (CARDIZEM) 30 MG tablet Take 0.5 tablets (15 mg total) by mouth daily as needed (for heart rate greater than 100 for more than 1 hour). 30 tablet 2   lisinopril  (PRINIVIL,ZESTRIL) 40 MG tablet Take 40 mg by mouth daily.     metoprolol succinate (TOPROL-XL) 100 MG 24 hr tablet Take 1 tablet (100 mg total) by mouth daily. Take with or immediately following a meal. 30 tablet 1   Omega-3 Fatty Acids (FISH OIL) 1000 MG CAPS Take 1,000 mg by mouth daily.     rivaroxaban (XARELTO) 20 MG TABS tablet Take 1 tablet (20 mg total) by mouth daily with supper. 90 tablet 1   amLODipine (NORVASC) 10 MG tablet Take 1 tablet (10 mg total) by mouth daily. 30 tablet 6         No facility-administered medications prior to visit.      Allergies:   Patient has no known allergies.   Social History   Socioeconomic History   Marital status: Single    Spouse name: Not on file   Number of children: Not on file   Years of education: Not on file   Highest education level: Not on file  Occupational History   Not on file  Tobacco Use   Smoking status: Never   Smokeless tobacco: Never  Substance and Sexual Activity   Alcohol use: Yes    Alcohol/week: 4.0 standard drinks    Types: 4 Cans of beer per week    Comment: occasional   Drug use: No   Sexual activity: Not on file  Other Topics Concern   Not on file  Social History Narrative   Not on file   Social Determinants of Health   Financial Resource Strain: Not on file  Food Insecurity: Not on file  Transportation Needs: Not on file  Physical Activity: Not on file  Stress: Not on file  Social Connections: Not on file     Family History:  The patient's family history includes Heart attack in his father; Hypertension in his father and mother; Kidney disease in his brother, father, and sister.   ROS:   Please see the history of present illness.    ROS  All other systems are reviewed and are negative.   PHYSICAL EXAM:   VS:  BP 112/74 (BP Location: Left Arm, Patient Position: Sitting, Cuff Size: Normal)   Pulse 61   Ht 5\' 9"  (1.753 m)   Wt 187 lb 12.8 oz (85.2 kg)   SpO2 97%   BMI 27.73 kg/m        General: Alert, oriented x3, no distress, mildly overweight Head: no evidence of trauma, PERRL, EOMI, no exophtalmos or lid lag, no myxedema, no xanthelasma; normal ears, nose and oropharynx Neck: normal jugular venous pulsations and no hepatojugular reflux; brisk carotid pulses without delay and no carotid bruits Chest: clear to auscultation, no signs of consolidation by percussion or palpation, normal fremitus, symmetrical and full respiratory excursions Cardiovascular: normal position and quality of the apical impulse, regular rhythm, normal first and second heart sounds, no murmurs, rubs or gallops Abdomen: no tenderness or distention, no masses by palpation, no abnormal pulsatility or arterial bruits, normal bowel sounds, no hepatosplenomegaly Extremities: no clubbing, cyanosis or edema; 2+ radial, ulnar and brachial pulses bilaterally; 2+ right  femoral, posterior tibial and dorsalis pedis pulses; 2+ left femoral, posterior tibial and dorsalis pedis pulses; no subclavian or femoral bruits Neurological: grossly nonfocal Psych: Normal mood and affect Mildly overweight  Wt Readings from Last 3 Encounters:  10/03/21 187 lb 12.8 oz (85.2 kg)  09/11/20 180 lb (81.6 kg)  09/05/19 180 lb (81.6 kg)    Studies/Labs Reviewed:   EKG:  EKG is ordered today.  Personally reviewed it shows normal sinus rhythm, T wave inversion leads V3-V6 similar to previous tracings.  The QRS is borderline prolonged at 102 ms (106 ms last year).  QTc is normal at 418 ms.  No results found for requested labs within last 8760 hours.   07/04/2020 Total cholesterol 131, HDL 35, LDL 68, triglycerides 116 Hemoglobin 14.3, creatinine 0.8, potassium 4.5, normal liver function test, TSH 1.1 08/06/2021 Cholesterol 168, HDL 30, LDL 91, triglycerides 280 Hemoglobin 14.7, creatinine 0.82, potassium 4.5 ASSESSMENT:    1. Paroxysmal atrial fibrillation (HCC)   2. Encounter for monitoring flecainide therapy   3.  Essential hypertension   4. Dyslipidemia   5. Aortic root dilation (HCC)   6. Long term current use of anticoagulant      PLAN:  In order of problems listed above:   1. AFib: Excellent symptomatic response to flecainide/beta-blocker therapy.   CHA2DS2-VASc Score is 1 (HTN), corresponding to 1 unadjusted Ischemic Stroke Rate of 0.6 % stroke rate/year.  On anticoagulation.  Plan repeat arrhythmia monitor in May or June of this year.  If flecainide therapy becomes ineffective or he develops side effects, I think he is an excellent candidate for radiofrequency ablation. 2. Flecainide: Has minor intraventricular conduction delay that predates treatment with flecainide.  Periodic treadmill exercise testing is indicated to monitor for development of side effects or signs of coronary disease. 3. HTN: Very well controlled. 4. HLP: He does not have known coronary or peripheral vascular disease.  On statin.  LDL and triglycerides are little higher and HDL is lower, all changes possibly related to weight gain.  His dose of atorvastatin was increased, but this may not help the low HDL.  Encouraged him to exercise more try to lose the 8 pounds or so that he has gained. 5. Ao dilation: Asymptomatic and stable.  Needs a CT angiogram for follow-up in the near future, probably next year. 6. Anticoagulation:  Xarelto. No bleeding complications   When the echocardiogram and Holter monitor are available he will need a letter for the [place].  This should include his current clinical status report, the results of the 24-hour Holter, list of medications, anticoagulant regimen tolerability, CHADSVasc core, status of aortic root dilation.  Medication Adjustments/Labs and Tests Ordered: Current medicines are reviewed at length with the patient today.  Concerns regarding medicines are outlined above.  Medication changes, Labs and Tests ordered today are listed in the Patient Instructions below. Patient Instructions   Medication Instructions:  No changes *If you need a refill on your cardiac medications before your next appointment, please call your pharmacy*   Lab Work: None ordered If you have labs (blood work) drawn today and your tests are completely normal, you will receive your results only by: [place] (if you have MyChart) OR A paper copy in the mail If you have any lab test that is abnormal or we need to change your treatment, we will call you to review the results.   Testing/Procedures: None ordered  We will place an order for a 48 hour monitor in May  2023.   Follow-Up: At [facility], you and your health needs are our priority.  As part of our continuing mission to provide you with exceptional heart care, we have created designated Provider Care Teams.  These Care Teams include your primary Cardiologist (physician) and Advanced Practice Providers (APPs -  Physician Assistants and Nurse Practitioners) who all work together to provide you with the care you need, when you need it.  We recommend signing up for the patient portal called "MyChart".  Sign up information is provided on this After Visit Summary.  MyChart is used to connect with patients for Virtual Visits (Telemedicine).  Patients are able to view lab/test results, encounter notes, upcoming appointments, etc.  Non-urgent messages can be sent to your provider as well.   To learn more about what you can do with MyChart, go to [email or web address].    Your next appointment:   12 month(s)  The format for your next appointment:   In Person  Provider:   Sanda Klein, MD       Signed, Sanda Klein, MD  10/04/2021 12:13 PM    [address], [place], [place]  [address] Phone: [phone]; Fax: [phone]

## 2021-12-29 ENCOUNTER — Other Ambulatory Visit: Payer: Self-pay | Admitting: Cardiovascular Disease

## 2022-02-24 ENCOUNTER — Encounter: Payer: Self-pay | Admitting: Cardiovascular Disease

## 2022-02-24 DIAGNOSIS — I480 Paroxysmal atrial fibrillation: Secondary | ICD-10-CM

## 2022-02-25 ENCOUNTER — Telehealth: Payer: Self-pay | Admitting: *Deleted

## 2022-02-25 NOTE — Telephone Encounter (Signed)
Please call Dion Parrow or Orpha Bur in monitor department at [facility] [address], [place], [place]  [phone] to schedule your 24 Hour Holter Monitor. You will need to come to our office to have the monitor applied and return the monitor to our office within a couple of days of wearing it for 24 hours. ?

## 2022-03-12 ENCOUNTER — Ambulatory Visit: Payer: BC Managed Care – PPO

## 2022-03-12 DIAGNOSIS — I480 Paroxysmal atrial fibrillation: Secondary | ICD-10-CM

## 2022-03-12 NOTE — Progress Notes (Unsigned)
[employer] multilead 24 hr holter monitor serial # [id number] applied in office for FAA clearance. Patient will return monitor after Wednesday, 03/18/2022.

## 2022-03-30 ENCOUNTER — Ambulatory Visit (INDEPENDENT_AMBULATORY_CARE_PROVIDER_SITE_OTHER): Payer: BC Managed Care – PPO

## 2022-03-30 DIAGNOSIS — I480 Paroxysmal atrial fibrillation: Secondary | ICD-10-CM

## 2022-03-30 NOTE — Progress Notes (Unsigned)
24 hour holter monitor applied and patient re-enrolled with [employer].  Monitor applied 03/12/22 had less than 2 minutes of date.  Recorder pulled from service.

## 2022-04-02 ENCOUNTER — Telehealth: Payer: Self-pay | Admitting: Cardiovascular Disease

## 2022-04-02 NOTE — Telephone Encounter (Signed)
Holter monitor started 03/30/22 at 9:02 AM.

## 2022-04-02 NOTE — Telephone Encounter (Signed)
New Message:     She needs to know the hook up time and date this patient started wearing this Monitor(24 hours)

## 2022-04-05 ENCOUNTER — Encounter: Payer: Self-pay | Admitting: Cardiovascular Disease

## 2022-04-13 ENCOUNTER — Encounter: Payer: Self-pay | Admitting: Cardiovascular Disease

## 2022-04-14 NOTE — Telephone Encounter (Signed)
Monitor interpreted - low burden pac's and pvc's - no afib. Ok to fly. Needs FAA letter.  Dr H (for Dr. Salena Saner)

## 2022-10-06 ENCOUNTER — Encounter: Payer: Self-pay | Admitting: Cardiovascular Disease

## 2022-10-06 ENCOUNTER — Ambulatory Visit: Payer: BC Managed Care – PPO | Attending: Cardiovascular Disease | Admitting: Cardiovascular Disease

## 2022-10-06 VITALS — BP 130/80 | HR 56 | Ht 69.0 in | Wt 191.6 lb

## 2022-10-06 DIAGNOSIS — E785 Hyperlipidemia, unspecified: Secondary | ICD-10-CM

## 2022-10-06 DIAGNOSIS — I10 Essential (primary) hypertension: Secondary | ICD-10-CM

## 2022-10-06 DIAGNOSIS — I480 Paroxysmal atrial fibrillation: Secondary | ICD-10-CM

## 2022-10-06 DIAGNOSIS — Z5181 Encounter for therapeutic drug level monitoring: Secondary | ICD-10-CM

## 2022-10-06 DIAGNOSIS — D6869 Other thrombophilia: Secondary | ICD-10-CM

## 2022-10-06 DIAGNOSIS — Z79899 Other long term (current) drug therapy: Secondary | ICD-10-CM

## 2022-10-06 NOTE — Patient Instructions (Signed)
Medication Instructions:  NO CHANGES  *If you need a refill on your cardiac medications before your next appointment, please call your pharmacy*   Testing/Procedures: Holter Monitor in May 2024   Follow-Up: At [facility], you and your health needs are our priority.  As part of our continuing mission to provide you with exceptional heart care, we have created designated Provider Care Teams.  These Care Teams include your primary Cardiologist (physician) and Advanced Practice Providers (APPs -  Physician Assistants and Nurse Practitioners) who all work together to provide you with the care you need, when you need it.  We recommend signing up for the patient portal called "MyChart".  Sign up information is provided on this After Visit Summary.  MyChart is used to connect with patients for Virtual Visits (Telemedicine).  Patients are able to view lab/test results, encounter notes, upcoming appointments, etc.  Non-urgent messages can be sent to your provider as well.   To learn more about what you can do with MyChart, go to [email or web address].    Your next appointment:   12 month(s)  The format for your next appointment:   In Person  Provider:   Thurmon Fair, MD

## 2022-10-06 NOTE — Progress Notes (Signed)
Patient ID: Alexander Reyes, male   DOB: 10-09-1956, 65 y.o.   MRN: [id number]     Cardiology Office Note    Date:  10/06/2022   ID:  Alexander Reyes, DOB 14-Apr-1956, MRN [id number] FAA: Virgel Manifold. 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  He is doing quite well and is completely asymptomatic.  It has been about 3 years since he had a meaningful episode of palpitations.  He is compliant with anticoagulation with Xarelto and has not had any falls, injuries or bleeding problems.  He denies exertional angina or dyspnea, orthopnea, PND, lower extremity edema or focal neurological events.  He is physically active and fit, but unfortunately has gained some weight, about 11 pounds in the last 2 years.  His heart rate is typically in the 50s (on metoprolol) and his blood pressure is consistently well-controlled.  His LDL cholesterol is excellent, but he has a chronically low HDL at 26.  Also has borderline high triglycerides, suggesting insulin resistance.    Unchanged ECG which shows T wave inversion in lateral leads (this has been present as far back as 20 years ago, although sometimes the T wave inversions less prominent).  The T wave changes appear to correlate to a minor intraventricular conduction delay that predates treatment with flecainide.  He works as a Presenter, broadcasting for [employer]. [place] requests an updated 48-hour Holter monitor with a minimum of 2-lead monitoring (they no longer require an echocardiogram).  The Holter monitor will be due in May or June of next year.  He had a normal  nuclear stress test in 2017 and a normal plain treadmill stress test in 2020.  In 2019 he had a normal left ventricle by echo (EF 55 to [id number], normal diastolic parameters), but also had a mildly dilated left atrium (end-systolic diameter 41 mm) and a borderline dilated aortic root (38 mm).  A 24-hour Holter monitor in 2017, after his admission with atrial fibrillation, showed extremely rare PACs and PVCs and was otherwise normal. An exercise myocardial perfusion study was completely normal. EF was 57%. He was able to exercise for 7 minutes and 30 seconds. Echocardiogram also showed normal left ventricular systolic function and there were no valvular abnormalities. Left ventricular walls are borderline hypertrophic. The left atrial diameter was 37 mm, at the upper limit of normal in size. While there was no overt diastolic dysfunction, his tissue Doppler early diastolic velocities were slightly low. Estimated PA pressure was normal.  He has a long-standing history of systemic hypertension dating back at least 20 years. He has strong family history of hypertension on both maternal and paternal sides. 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 The workup was normal. He has never had a myocardial infarction, congestive heart failure, stroke, peripheral embolic events, TIA or other major serious medical problems. He does take a statin for hyperlipidemia. He does not smoke he does not have diabetes mellitus.    Past Medical History:  Diagnosis Date   Fibromyalgia    Hypertension    Mixed  dyslipidemia     Past Surgical History:  Procedure Laterality Date   FOOT SURGERY     2009   HERNIA REPAIR     2006    Current Medications: Outpatient Medications Prior to Visit  Medication Sig Dispense Refill   atorvastatin (LIPITOR) 40 MG tablet Take 40 mg by mouth daily.     diltiazem (CARDIZEM) 30 MG tablet Take  0.5 tablets (15 mg total) by mouth daily as needed (for heart rate greater than 100 for more than 1 hour). 30 tablet 2   lisinopril (PRINIVIL,ZESTRIL) 40 MG tablet Take 40 mg by mouth daily.     metoprolol succinate (TOPROL-XL) 100 MG 24 hr tablet Take 1 tablet (100 mg total) by mouth daily. Take with or immediately following a meal. 30 tablet 1   Omega-3 Fatty Acids (FISH OIL) 1000 MG CAPS Take 1,000 mg by mouth daily.     rivaroxaban (XARELTO) 20 MG TABS tablet Take 1 tablet (20 mg total) by mouth daily with supper. 90 tablet 1   amLODipine (NORVASC) 10 MG tablet Take 1 tablet (10 mg total) by mouth daily. 30 tablet 6         No facility-administered medications prior to visit.      Allergies:   Patient has no known allergies.   Social History   Socioeconomic History   Marital status: Single    Spouse name: Not on file   Number of children: Not on file   Years of education: Not on file   Highest education level: Not on file  Occupational History   Not on file  Tobacco Use   Smoking status: Never   Smokeless tobacco: Never  Substance and Sexual Activity   Alcohol use: Yes    Alcohol/week: 4.0 standard drinks of alcohol    Types: 4 Cans of beer per week    Comment: occasional   Drug use: No   Sexual activity: Not on file  Other Topics Concern   Not on file  Social History Narrative   Not on file   Social Determinants of Health   Financial Resource Strain: Not on file  Food Insecurity: Not on file  Transportation Needs: Not on file  Physical Activity: Not on file  Stress: Not on file  Social Connections: Not on file     Family History:  The patient's family history includes Heart attack in his father; Hypertension in his father and mother; Kidney disease in his brother, father, and sister.   ROS:   Please see the history of present illness.    ROS  All other systems are reviewed and are negative.   PHYSICAL EXAM:   VS:  BP 130/80   Pulse (!) 56   Ht 5\' 9"   (1.753 m)   Wt 86.9 kg   SpO2 98%   BMI 28.29 kg/m      General: Alert, oriented x3, no distress, mildly overweight Head: no evidence of trauma, PERRL, EOMI, no exophtalmos or lid lag, no myxedema, no xanthelasma; normal ears, nose and oropharynx Neck: normal jugular venous pulsations and no hepatojugular reflux; brisk carotid pulses without delay and no carotid bruits Chest: clear to auscultation, no signs of consolidation by percussion or palpation, normal fremitus, symmetrical and full respiratory excursions Cardiovascular: normal position and quality of the apical impulse, regular rhythm, normal first and second heart sounds, no murmurs, rubs or gallops Abdomen: no tenderness or distention, no masses by palpation, no abnormal pulsatility or arterial bruits, normal bowel sounds,  no hepatosplenomegaly Extremities: no clubbing, cyanosis or edema; 2+ radial, ulnar and brachial pulses bilaterally; 2+ right femoral, posterior tibial and dorsalis pedis pulses; 2+ left femoral, posterior tibial and dorsalis pedis pulses; no subclavian or femoral bruits Neurological: grossly nonfocal Psych: Normal mood and affect   Wt Readings from Last 3 Encounters:  10/06/22 86.9 kg  10/03/21 85.2 kg  09/11/20 81.6 kg    Studies/Labs Reviewed:   EKG:  EKG is ordered today.  Personally reviewed, it looks very similar to previous tracings.  He has mild sinus bradycardia, borderline QRS prolongation at about 100 ms, chronic T wave inversion leads V4-V6, normal QTc 429 ms   No results found for requested labs within last 365 days.   07/04/2020 Total cholesterol 131, HDL 35, LDL 68, triglycerides 116 Hemoglobin 14.3, creatinine 0.8, potassium 4.5, normal liver function test, TSH 1.1 08/06/2021 Cholesterol 168, HDL 30, LDL 91, triglycerides 280 Hemoglobin 14.7, creatinine 0.82, potassium 4.5   ASSESSMENT:    1. Paroxysmal atrial fibrillation (HCC)   2. Encounter for monitoring flecainide therapy   3.  Essential hypertension   4. Dyslipidemia (high LDL; low HDL)   5. Acquired thrombophilia (HCC)      PLAN:  In order of problems listed above:   1. AFib: Clinically asymptomatic on a combination of flecainide and beta-blocker.  CHA2DS2-VASc score is 1 for hypertension, would increase to a score of 2 in a few months when he reaches the age of 21.  Compliant with anticoagulation.  Plan repeat arrhythmia monitor in May or June of this year.  If flecainide therapy becomes ineffective or he develops side effects, I think he is an excellent candidate for radiofrequency ablation.  At this point, the right revision brevity of his events does not justify invasive procedures. 2. Flecainide: Very mild QRS interval addition precedes treatment with flecainide.  Periodic treadmill exercise testing is indicated to monitor for development of side effects or signs of coronary disease.  Plan treadmill stress test next year. 3. HTN: Well-controlled. 4. HLP: Total and LDL cholesterol are very much acceptable but he has borderline hypertriglyceridemia and a very low HDL, changes which have worsened with weight gain.  Needs to work harder on diet and start exercising regularly to help shed those pounds and improve his lipid profile.   5. Ao dilation: Borderline, asymptomatic.  6. Anticoagulation: Denies falls and has not had any bleeding complications.   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  This should include his current clinical status report, the results of the 24-hour Holter, list of medications, anticoagulant regimen tolerability, CHADSVasc score.  Medication Adjustments/Labs and Tests Ordered: Current medicines are reviewed at length with the patient today.  Concerns regarding medicines are outlined above.  Medication changes, Labs and Tests ordered today are listed in the Patient Instructions below. Patient Instructions  Medication Instructions:  NO CHANGES  *If you need  a refill on your cardiac medications before your next appointment, please call your pharmacy*   Testing/Procedures: Holter Monitor in May 2024   Follow-Up: At [facility], you and your health needs are our priority.  As part of our continuing mission to provide you with exceptional heart care, we have created designated Provider Care Teams.  These Care Teams include your primary Cardiologist (physician) and Advanced Practice Providers (APPs -  Physician Assistants and Nurse Practitioners) who all work together to provide you with the care you need, when you need it.  We  recommend signing up for the patient portal called "MyChart".  Sign up information is provided on this After Visit Summary.  MyChart is used to connect with patients for Virtual Visits (Telemedicine).  Patients are able to view lab/test results, encounter notes, upcoming appointments, etc.  Non-urgent messages can be sent to your provider as well.   To learn more about what you can do with MyChart, go to [email or web address].    Your next appointment:   12 month(s)  The format for your next appointment:   In Person  Provider:   Sanda Klein, MD      Signed, Sanda Klein, MD  10/06/2022 5:52 PM    [address], [place], [place]  [address] Phone: [phone]; Fax: [phone]

## 2022-10-29 ENCOUNTER — Telehealth: Payer: Self-pay | Admitting: *Deleted

## 2022-10-29 NOTE — Telephone Encounter (Signed)
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

## 2022-10-30 NOTE — Telephone Encounter (Signed)
Patient is returning call.  °

## 2022-11-02 NOTE — Telephone Encounter (Signed)
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 Patient needs within 3 months of July appointment.

## 2022-11-08 ENCOUNTER — Other Ambulatory Visit: Payer: Self-pay | Admitting: Cardiovascular Disease

## 2022-11-09 NOTE — Telephone Encounter (Signed)
Prescription refill request for Xarelto received.  Indication:afib Last office visit:12/23 Weight:86.9 kg Age:67 Scr:0.8 CrCl:114.66  ml/min  Prescription refilled

## 2022-12-24 ENCOUNTER — Other Ambulatory Visit: Payer: Self-pay | Admitting: Cardiovascular Disease

## 2023-01-07 ENCOUNTER — Encounter: Payer: Self-pay | Admitting: Cardiovascular Disease

## 2023-01-07 NOTE — Telephone Encounter (Signed)
We can increase the flecainide to 100 mg twice daily, but I would like him to come in for an ECG (nurse visit?) after 3-4 days on the new dose.

## 2023-01-11 ENCOUNTER — Ambulatory Visit: Payer: BC Managed Care – PPO | Attending: Internal Medicine | Admitting: *Deleted

## 2023-01-11 VITALS — BP 139/84 | HR 53 | Resp 18 | Ht 69.0 in | Wt 192.2 lb

## 2023-01-11 DIAGNOSIS — I480 Paroxysmal atrial fibrillation: Secondary | ICD-10-CM

## 2023-01-11 MED ORDER — FLECAINIDE ACETATE 100 MG PO TABS: 100.0000 mg | ORAL_TABLET | Freq: Two times a day (BID) | ORAL | 3 refills | Status: DC

## 2023-01-11 NOTE — Progress Notes (Signed)
Flecainide dose increased to 100mg  bid. Pt in for EKG post 4 days on new dose. Pt states that he has felt great ever since the dose change. No irregular beats, no symptoms.  EKG performed- reviewed by Dr Debara Pickett- DOD- who compared to previous EKG as well. Sent in a prescription for the new dose to preferred pharmacy. Told patient to call with any questions/concerns.

## 2023-01-12 ENCOUNTER — Encounter: Payer: Self-pay | Admitting: Cardiovascular Disease

## 2023-01-12 NOTE — Telephone Encounter (Signed)
ECG does show slight broadening of the QRS complex at 118 ms, from previous baseline of 102-108 ms. This is likely to be the highest dose of flecainide that we can use. If it does not work well enough, will probably need to switch to a different antiarrhythmic medication like dofetilide or consider ablation.

## 2023-03-09 ENCOUNTER — Ambulatory Visit: Payer: BC Managed Care – PPO | Attending: Cardiology

## 2023-03-09 DIAGNOSIS — I480 Paroxysmal atrial fibrillation: Secondary | ICD-10-CM | POA: Diagnosis not present

## 2023-03-09 NOTE — Progress Notes (Unsigned)
24 hour [employer] holter monitor serial # [id number] applied to patient. Dr. Royann Shivers to read.  Patient to return Friday, 03/12/23.

## 2023-03-17 ENCOUNTER — Encounter: Payer: Self-pay | Admitting: Cardiovascular Disease

## 2023-03-17 MED ORDER — METOPROLOL SUCCINATE ER 100 MG PO TB24: 50.0000 mg | ORAL_TABLET | Freq: Every day | ORAL | 0 refills | Status: DC

## 2023-03-17 NOTE — Telephone Encounter (Signed)
Please reduce metoprolol succinate to 50 mg daily. Letter is ready.

## 2023-04-06 ENCOUNTER — Encounter: Payer: Self-pay | Admitting: Cardiovascular Disease

## 2023-04-06 NOTE — Telephone Encounter (Signed)
I am not sure if the double vision is the same thing is blurry vision.  What I would suggest that we do, especially since it seems like atrial fibrillation has been pretty well-controlled would be to hold a dose, and then reduce to half tablet twice a day to see if that makes a difference.  Will be need to see is: Does the blurred vision improve, and also does the A-fib increase.  What I would do is take to 50 mg twice a day, and if there is a spell of atrial fibrillation take the 100 mg twice a day for couple days until it goes away.  After couple weeks reassess the vision symptoms.  Will defer major adjustments to medications to Dr. Royann Shivers when he returns.  Bryan Lemma, MD

## 2023-04-09 ENCOUNTER — Encounter: Payer: Self-pay | Admitting: Cardiovascular Disease

## 2023-04-12 ENCOUNTER — Encounter: Payer: Self-pay | Admitting: Cardiovascular Disease

## 2023-05-31 ENCOUNTER — Ambulatory Visit
Admission: RE | Admit: 2023-05-31 | Discharge: 2023-05-31 | Disposition: A | Discharge status: Home / Self Care | Payer: BC Managed Care – PPO | Source: Ambulatory Visit | Attending: Family Medicine | Admitting: Family Medicine

## 2023-05-31 ENCOUNTER — Other Ambulatory Visit: Payer: Self-pay | Admitting: Family Medicine

## 2023-05-31 DIAGNOSIS — M79602 Pain in left arm: Secondary | ICD-10-CM

## 2023-07-22 DIAGNOSIS — H5202 Hypermetropia, left eye: Secondary | ICD-10-CM | POA: Diagnosis not present

## 2023-07-22 DIAGNOSIS — Z961 Presence of intraocular lens: Secondary | ICD-10-CM | POA: Diagnosis not present

## 2023-07-22 DIAGNOSIS — H16223 Keratoconjunctivitis sicca, not specified as Sjogren's, bilateral: Secondary | ICD-10-CM | POA: Diagnosis not present

## 2023-07-22 DIAGNOSIS — H524 Presbyopia: Secondary | ICD-10-CM | POA: Diagnosis not present

## 2023-07-22 DIAGNOSIS — H5211 Myopia, right eye: Secondary | ICD-10-CM | POA: Diagnosis not present

## 2023-07-22 DIAGNOSIS — H52223 Regular astigmatism, bilateral: Secondary | ICD-10-CM | POA: Diagnosis not present

## 2023-07-22 DIAGNOSIS — H40053 Ocular hypertension, bilateral: Secondary | ICD-10-CM | POA: Diagnosis not present

## 2023-09-29 NOTE — Progress Notes (Signed)
Cardiology Office Note    Date:  10/01/2023  ID:  Alexander Reyes, DOB 02-Nov-1955, MRN [id number] PCP:  [facility] [place]  Cardiologist:  Thurmon Fair, MD  Electrophysiologist:  None   Chief Complaint: Follow up for paroxysmal atrial fibrillation   History of Present Illness: .    Alexander Reyes is a 67 y.o. male with visit-pertinent history of infrequent paroxysmal atrial fibrillation with rapid ventricular response, systemic hypertension, mild dilation of the ascending aorta.  Patient has a longstanding history of systemic hypertension dating back at least 20 years, he is a strong family history of hypertension.  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  Per Dr. Erin Hearing notes he has never had a myocardial infarction, congestive heart failure, stroke, peripheral embolus event, TIA or other serious medical problem.  A 24-hour Holter monitor in 2017 showed extremely rare PACs and PVCs and was otherwise normal.  He underwent an exercise myocardial perfusion study which was completely normal.  EF was 57%.  Echocardiogram showed normal LV systolic function and there was no valvular abnormalities.  Left ventricular walls were borderline hypertrophic, left atrial diameter was 37 mm at the upper limit of normal in size.  There is no overt diastolic dysfunction.  In 2019 echocardiogram indicated LVEF 55 to 60%, normal diastolic parameters, mildly dilated left atrium and a borderline dilated aortic root at 38 mm.  He was last seen in clinic by Dr. Royann Shivers on 10/06/2022.  He had remained stable from a cardiac perspective.  It had been 3 years since an episode of palpitations, he denied angina or dyspnea.  Today he presents for follow-up.  He reports that he is doing very well, he has no concerns or complaints today.  He denies any palpitations or feeling of  atrial fibrillation, his previously noted blurry/double vision has been resolved with the change in glasses.  He denies chest pain, shortness of breath, dizziness, lightheadedness, presyncope, syncope lower extremity edema, orthopnea or PND.  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  He walks a mile a few days each week and tolerates well.  ROS: .   Today he denies chest pain, shortness of breath, lower extremity edema, fatigue, palpitations, melena, hematuria, hemoptysis, diaphoresis, weakness, presyncope, syncope, orthopnea, and PND.  All other systems are reviewed and otherwise negative. Studies Reviewed: Marland Kitchen    EKG:  EKG is ordered today, personally reviewed, demonstrating  EKG Interpretation Date/Time:  Friday October 01 2023 08:11:50 EST Ventricular Rate:  59 PR Interval:  190 QRS Duration:  108 QT Interval:  440 QTC Calculation: 435 R Axis:   105  Text Interpretation: Sinus bradycardia Rightward axis T wave abnormality V4-V6 Confirmed by Reather Littler [phone]) on 10/01/2023 5:00:20 PM   CV Studies:  Cardiac Studies & Procedures     STRESS TESTS  EXERCISE TOLERANCE TEST (ETT) 04/27/2019  Narrative  There was no ST segment deviation noted during stress.  On flecainide no significant NSVT/VT or QRS widening with stress In recovery marked T wave inversions in inferior lateral leads Baseline ECG with inferior lateral T wave changes   ECHOCARDIOGRAM  ECHOCARDIOGRAM COMPLETE 09/19/2018  Narrative *[facility]* [address] [place], [place] [address] [phone]  ------------------------------------------------------------------- Echocardiography  Patient:    Alexander Reyes, Alexander Reyes MR #:  [id number] Study Date: 09/19/2018 Gender:     M Age:        62 Height:     175.3 cm Weight:     82.8 kg BSA:        2.02 m^2 Pt. Status: Room:  ATTENDING    Thurmon Fair, MD ORDERING     Thurmon Fair,  MD REFERRING    Thurmon Fair, MD SONOGRAPHER  [facility] PERFORMING   [facility]  cc:  ------------------------------------------------------------------- LV EF: 55% -   60%  ------------------------------------------------------------------- Indications:      Atrial fibrillation - 427.31.  ------------------------------------------------------------------- History:   PMH:  Aortic root dilation , Long term current use of anticoagulant.  Atrial fibrillation.  Risk factors:  Hypertension. Dyslipidemia.  ------------------------------------------------------------------- Study Conclusions  - Left ventricle: The cavity size was normal. Systolic function was normal. The estimated ejection fraction was in the range of 55% to 60%. Wall motion was normal; there were no regional wall motion abnormalities. Left ventricular diastolic function parameters were normal. - Left atrium: The atrium was mildly dilated. - Atrial septum: No defect or patent foramen ovale was identified.  ------------------------------------------------------------------- Study data:  Comparison was made to the study of 10/01/2017.  Study status:  Routine.  Procedure:  Transthoracic echocardiography. Image quality was adequate.          Echocardiography.  M-mode, complete 2D, spectral Doppler, and color Doppler.  Birthdate: Patient birthdate: [birth year].  Age:  Patient is 67 yr old.  Sex: Gender: male.    BMI: 27 kg/m^2.  Patient status:  Outpatient. Study date:  Study date: 09/19/2018. Study time: 11:30 AM. Location:  Echo laboratory.  -------------------------------------------------------------------  ------------------------------------------------------------------- Left ventricle:  The cavity size was normal. Systolic function was normal. The estimated ejection fraction was in the range of 55% to 60%. Wall motion was normal; there were no regional wall motion abnormalities. The transmitral  flow pattern was normal. The deceleration time of the early transmitral flow velocity was normal. The pulmonary vein flow pattern was normal. The tissue Doppler parameters were normal. Left ventricular diastolic function parameters were normal.  ------------------------------------------------------------------- Aortic valve:   Trileaflet; mildly thickened leaflets. Mobility was not restricted.  Doppler:  Transvalvular velocity was within the normal range. There was no stenosis. There was no regurgitation.  ------------------------------------------------------------------- Aorta:  Aortic root: The aortic root was normal in size. Ascending aorta: The ascending aorta was mildly dilated.  ------------------------------------------------------------------- Mitral valve:   Mildly thickened leaflets . Mobility was not restricted.  Doppler:  Transvalvular velocity was within the normal range. There was no evidence for stenosis. There was trivial regurgitation.  ------------------------------------------------------------------- Left atrium:  The atrium was mildly dilated.  ------------------------------------------------------------------- Atrial septum:  No defect or patent foramen ovale was identified.  ------------------------------------------------------------------- Right ventricle:  The cavity size was normal. Wall thickness was normal. Systolic function was normal.  ------------------------------------------------------------------- Pulmonic valve:    Doppler:  Transvalvular velocity was within the normal range. There was no evidence for stenosis. There was mild regurgitation.  ------------------------------------------------------------------- Tricuspid valve:   Structurally normal valve.    Doppler: Transvalvular velocity was within the normal range. There was no regurgitation.  ------------------------------------------------------------------- Pulmonary artery:   The  main pulmonary artery was normal-sized. Systolic pressure was within the normal range.  ------------------------------------------------------------------- Right atrium:  The atrium was normal in size.  ------------------------------------------------------------------- Pericardium:  The pericardium was normal in appearance. There was no pericardial effusion.  ------------------------------------------------------------------- Systemic veins: Inferior vena cava: The vessel was normal in size. The respirophasic diameter  changes were in the normal range (>= 50%), consistent with normal central venous pressure.  ------------------------------------------------------------------- Measurements  Left ventricle                         Value        Reference LV ID, ED, PLAX chordal                50.5  mm     43 - 52 LV ID, ES, PLAX chordal                27.9  mm     23 - 38 LV fx shortening, PLAX chordal         45    %      >=29 LV PW thickness, ED                    9.85  mm     ---------- IVS/LV PW ratio, ED                    1.09         <=1.3 LV e&', lateral                         6.1   cm/s   ---------- LV E/e&', lateral                       10.97        ---------- LV e&', medial                          7.33  cm/s   ---------- LV E/e&', medial                        9.13         ---------- LV e&', average                         6.72  cm/s   ---------- LV E/e&', average                       9.96         ----------  Ventricular septum                     Value        Reference IVS thickness, ED                      10.7  mm     ----------  LVOT                                   Value        Reference LVOT ID, S                             22    mm     ---------- LVOT area                              3.8   cm^2   ----------  Aorta  Value        Reference Aortic root ID, ED                     38    mm     ----------  Left atrium                             Value        Reference LA ID, A-P, ES                         41    mm     ---------- LA ID/bsa, A-P                         2.03  cm/m^2 <=2.2 LA volume, S                           66.1  ml     ---------- LA volume/bsa, S                       32.7  ml/m^2 ---------- LA volume, ES, 1-p A4C                 60.7  ml     ---------- LA volume/bsa, ES, 1-p A4C             30    ml/m^2 ---------- LA volume, ES, 1-p A2C                 67.7  ml     ---------- LA volume/bsa, ES, 1-p A2C             33.5  ml/m^2 ----------  Mitral valve                           Value        Reference Mitral E-wave peak velocity            66.9  cm/s   ---------- Mitral A-wave peak velocity            57.7  cm/s   ---------- Mitral deceleration time       (H)     232   ms     150 - 230 Mitral E/A ratio, peak                 1.2          ----------  Right atrium                           Value        Reference RA ID, S-I, ES, A4C                    48.4  mm     34 - 49 RA area, ES, A4C                       14.7  cm^2   8.3 - 19.5 RA volume, ES, A/L                     36.5  ml     ---------- RA volume/bsa, ES, A/L  18    ml/m^2 ----------  Systemic veins                         Value        Reference Estimated CVP                          3     mm Hg  ----------  Right ventricle                        Value        Reference TAPSE                                  24    mm     ---------- RV s&', lateral, S                      10.4  cm/s   ----------  Legend: (L)  and  (H)  mark values outside specified reference range.  ------------------------------------------------------------------- Prepared and Electronically Authenticated by  Charlton Haws, M.D. 2019-11-25T13:02:25    MONITORS  LONG TERM MONITOR (3-14 DAYS) 10/16/2019  Narrative  Dominant rhythm is normal sinus and mild sinus bradycardia. Blunted circadian rhythm pattern.  No evidence of atrial  fibrillation.  Medication-related bradycardia and chronotropic incompetence. Otherwise normal arrhythmia monitor. Atrial fibrillation is not seen.            Current Reported Medications:.    Current Meds  Medication Sig   acetaminophen (TYLENOL) 325 MG tablet Take 325 mg by mouth as needed.   amLODipine (NORVASC) 10 MG tablet Take 1 tablet (10 mg total) by mouth daily.   atorvastatin (LIPITOR) 80 MG tablet Take 80 mg by mouth daily.   dorzolamide-timolol (COSOPT) 22.3-6.8 MG/ML ophthalmic solution Place 1 drop into both eyes daily in the afternoon.   flecainide (TAMBOCOR) 100 MG tablet Take 1 tablet (100 mg total) by mouth 2 (two) times daily.   lisinopril (PRINIVIL,ZESTRIL) 40 MG tablet Take 40 mg by mouth daily.   metoprolol succinate (TOPROL-XL) 100 MG 24 hr tablet Take 0.5 tablets (50 mg total) by mouth daily. Take with or immediately following a meal.   Omega-3 Fatty Acids (FISH OIL) 1000 MG CAPS Take 1,000 mg by mouth daily.   XARELTO 20 MG TABS tablet TAKE 1 TABLET DAILY WITH SUPPER   Physical Exam:    VS:  BP 136/84   Pulse (!) 59   Ht 5\' 9"  (1.753 m)   Wt 193 lb 6.4 oz (87.7 kg)   SpO2 99%   BMI 28.56 kg/m    Wt Readings from Last 3 Encounters:  10/01/23 193 lb 6.4 oz (87.7 kg)  01/11/23 192 lb 3.2 oz (87.2 kg)  10/06/22 191 lb 9.6 oz (86.9 kg)    GEN: Well nourished, well developed in no acute distress NECK: No JVD; No carotid bruits CARDIAC: RRR, no murmurs, rubs, gallops RESPIRATORY:  Clear to auscultation without rales, wheezing or rhonchi  ABDOMEN: Soft, non-tender, non-distended EXTREMITIES:  No edema; No acute deformity   Asessement and Plan:.    Paroxysmal atrial fibrillation/Flecainide monitoring: Patient with history of paroxysmal atrial fibrillation with rapid ventricular response.  He has been well-maintained on metoprolol and flecainide.  In 03/2023 his flecainide was reduced to 50 mg twice a day in setting  of blurry vision, after reduction patient  reported improvement and no atrial fibrillation episodes.  Today he denies any further episodes of atrial fibrillation and blurry/double vision has resolved with a change of glasses.  EKG today indicates sinus bradycardia at 59 bpm, QRS duration 108 ms, QT/QTc 440/435 ms, he has chronic t wave abnormality in V4-V6. CHA2DS2-VASc Score = 2 [CHF History: 0, HTN History: 1, Diabetes History: 0, Stroke History: 0, Vascular Disease History: 0, Age Score: 1, Gender Score: 0].  Therefore, the patient's annual risk of stroke is 2.2 %.  He reports compliance with Xarelto, denies any bleeding problems or falls.  Patient is due for treadmill exercise testing for flecainide monitoring, he is agreeable to this, see consent below.  He will hold his metoprolol prior to ETT. 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  Continue flecainide, metoprolol and Xarelto. Check CBC, BMET and Mag.  Informed Consent   Shared Decision Making/Informed Consent The risks [chest pain, shortness of breath, cardiac arrhythmias, dizziness, blood pressure fluctuations, myocardial infarction, stroke/transient ischemic attack, and life-threatening complications (estimated to be 1 in 10,000)], benefits (risk stratification, diagnosing coronary artery disease, treatment guidance) and alternatives of an exercise tolerance test were discussed in detail with Alexander Reyes and he agrees to proceed.      Hypertension: Initial blood pressure today 136/84 on recheck was 130/78.  On Norvasc 10 mg daily, metoprolol succinate 50 mg daily and lisinopril 40 mg daily.  Hyperlipidemia: Patient reports that he is due for a physical in January which she will complete with his PCP.  He is expecting his lipid profile to be checked at that time. Heart healthy diet and regular cardiovascular exercise encouraged.     Disposition: F/u with Dr. Royann Shivers in one year.   Signed, Rip Harbour, NP

## 2023-10-01 ENCOUNTER — Encounter: Payer: Self-pay | Admitting: Cardiology

## 2023-10-01 ENCOUNTER — Telehealth: Payer: Self-pay

## 2023-10-01 ENCOUNTER — Ambulatory Visit: Payer: BC Managed Care – PPO | Admitting: Physician Assistant

## 2023-10-01 ENCOUNTER — Ambulatory Visit: Payer: Medicare Other | Attending: Cardiology | Admitting: Cardiology

## 2023-10-01 VITALS — BP 130/78 | HR 59 | Ht 69.0 in | Wt 193.4 lb

## 2023-10-01 DIAGNOSIS — I10 Essential (primary) hypertension: Secondary | ICD-10-CM

## 2023-10-01 DIAGNOSIS — Z5181 Encounter for therapeutic drug level monitoring: Secondary | ICD-10-CM

## 2023-10-01 DIAGNOSIS — Z7901 Long term (current) use of anticoagulants: Secondary | ICD-10-CM

## 2023-10-01 DIAGNOSIS — Z79899 Other long term (current) drug therapy: Secondary | ICD-10-CM | POA: Diagnosis not present

## 2023-10-01 DIAGNOSIS — E785 Hyperlipidemia, unspecified: Secondary | ICD-10-CM | POA: Diagnosis not present

## 2023-10-01 DIAGNOSIS — I480 Paroxysmal atrial fibrillation: Secondary | ICD-10-CM | POA: Diagnosis not present

## 2023-10-01 NOTE — Patient Instructions (Signed)
Medication Instructions:  No changes *If you need a refill on your cardiac medications before your next appointment, please call your pharmacy*   Lab Work: Today we will draw CBC, BMP, and mag If you have labs (blood work) drawn today and your tests are completely normal, you will receive your results only by: MyChart Message (if you have MyChart) OR A paper copy in the mail If you have any lab test that is abnormal or we need to change your treatment, we will call you to review the results.   Testing/Procedures: Your physician has requested that you have an Exercise Stress Test. An exercise tolerance test is a test to check how your heart works during exercise. You will need to walk on a treadmill for this test. An electrocardiogram (ECG) will record your heartbeat when you are at rest and when you are exercising.    Please arrive 15 minutes prior to your appointment time for registration and insurance purposes.   The test will take approximately 45 minutes to complete.   How to prepare for your Exercise Stress Test: Do bring a list of your current medications with you.  If not listed below, you may take your medications as normal. Do wear comfortable clothes (no dresses or overalls) and walking shoes, tennis shoes preferred (no heels or open toed shoes are allowed) Do Not wear cologne, perfume, aftershave or lotions (deodorant is allowed). Do not drink or eat foods with caffeine for 24 hours before the test. (Chocolate, coffee, tea, or energy drinks) If you use an inhaler, bring it with you to the test. Do not smoke for 4 hours before the test.    If these instructions are not followed, your test will have to be rescheduled.   If you cannot keep your appointment, please provide 24 hours notification to our office, to avoid a possible $50 charge to your account.  Your physician has recommended that you wear a holter monitor. Holter monitors are medical devices that record the  heart's electrical activity. Doctors most often use these monitors to diagnose arrhythmias. Arrhythmias are problems with the speed or rhythm of the heartbeat. The monitor is a small, portable device. You can wear one while you do your normal daily activities. This is usually used to diagnose what is causing palpitations/syncope (passing out).   Follow-Up: At [facility], you and your health needs are our priority.  As part of our continuing mission to provide you with exceptional heart care, we have created designated Provider Care Teams.  These Care Teams include your primary Cardiologist (physician) and Advanced Practice Providers (APPs -  Physician Assistants and Nurse Practitioners) who all work together to provide you with the care you need, when you need it.  We recommend signing up for the patient portal called "MyChart".  Sign up information is provided on this After Visit Summary.  MyChart is used to connect with patients for Virtual Visits (Telemedicine).  Patients are able to view lab/test results, encounter notes, upcoming appointments, etc.  Non-urgent messages can be sent to your provider as well.   To learn more about what you can do with MyChart, go to [email or web address].    Your next appointment:   1 year(s)  Provider:   Thurmon Fair, MD

## 2023-10-01 NOTE — Telephone Encounter (Signed)
 Patient called back advised of below they verbalized understanding

## 2023-10-01 NOTE — Telephone Encounter (Signed)
Called patient to advise of holding metoprolol prior to ETT Per [facility]

## 2023-10-02 LAB — CBC
Hematocrit: 44.3 % (ref 37.5–51.0)
Hemoglobin: 15 g/dL (ref 13.0–17.7)
MCH: 32.8 pg (ref 26.6–33.0)
MCHC: 33.9 g/dL (ref 31.5–35.7)
MCV: 97 fL (ref 79–97)
Platelets: 308 10*3/uL (ref 150–450)
RBC: 4.58 x10E6/uL (ref 4.14–5.80)
RDW: 12.7 % (ref 11.6–15.4)
WBC: 5.4 10*3/uL (ref 3.4–10.8)

## 2023-10-02 LAB — BASIC METABOLIC PANEL
BUN/Creatinine Ratio: 14 (ref 10–24)
BUN: 12 mg/dL (ref 8–27)
CO2: 23 mmol/L (ref 20–29)
Calcium: 9.7 mg/dL (ref 8.6–10.2)
Chloride: 101 mmol/L (ref 96–106)
Creatinine, Ser: 0.84 mg/dL (ref 0.76–1.27)
Glucose: 99 mg/dL (ref 70–99)
Potassium: 4.5 mmol/L (ref 3.5–5.2)
Sodium: 141 mmol/L (ref 134–144)
eGFR: 97 mL/min/{1.73_m2} (ref >59)

## 2023-10-02 LAB — MAGNESIUM: Magnesium: 2.2 mg/dL (ref 1.6–2.3)

## 2023-11-02 DIAGNOSIS — Z0289 Encounter for other administrative examinations: Secondary | ICD-10-CM | POA: Diagnosis not present

## 2023-11-03 ENCOUNTER — Ambulatory Visit: Payer: Medicare Other | Attending: Cardiology

## 2023-11-03 DIAGNOSIS — Z79899 Other long term (current) drug therapy: Secondary | ICD-10-CM

## 2023-11-03 DIAGNOSIS — Z5181 Encounter for therapeutic drug level monitoring: Secondary | ICD-10-CM

## 2023-11-04 LAB — EXERCISE TOLERANCE TEST
Angina Index: 0
Duke Treadmill Score: 5
Estimated workload: 7
Exercise duration (min): 5 min
Exercise duration (sec): 20 s
MPHR: 155 {beats}/min
Peak HR: 129 {beats}/min
Percent HR: 83 %
RPE: 17
Rest HR: 71 {beats}/min
ST Depression (mm): 0 mm

## 2023-11-07 ENCOUNTER — Encounter: Payer: Self-pay | Admitting: Cardiovascular Disease

## 2023-11-08 MED ORDER — MULTAQ 400 MG PO TABS: 400.0000 mg | ORAL_TABLET | Freq: Two times a day (BID) | ORAL | 11 refills | Status: DC

## 2023-11-08 NOTE — Telephone Encounter (Signed)
 Croitoru, Rachelle Hora, MD  Scheryl Marten, RN New plan. Can we please stop flecainide, start Multaq 400 mg twice daily and make him and appt with me instead of EP in about 6-8 weeks. Thanks.

## 2023-11-15 ENCOUNTER — Encounter: Payer: Self-pay | Admitting: Cardiovascular Disease

## 2023-11-15 ENCOUNTER — Ambulatory Visit: Payer: Medicare Other | Attending: Cardiovascular Disease | Admitting: Cardiovascular Disease

## 2023-11-15 VITALS — BP 134/66 | HR 60 | Ht 69.0 in | Wt 198.2 lb

## 2023-11-15 DIAGNOSIS — Z79899 Other long term (current) drug therapy: Secondary | ICD-10-CM

## 2023-11-15 DIAGNOSIS — E785 Hyperlipidemia, unspecified: Secondary | ICD-10-CM

## 2023-11-15 DIAGNOSIS — Z5181 Encounter for therapeutic drug level monitoring: Secondary | ICD-10-CM | POA: Diagnosis not present

## 2023-11-15 DIAGNOSIS — D6869 Other thrombophilia: Secondary | ICD-10-CM

## 2023-11-15 DIAGNOSIS — I480 Paroxysmal atrial fibrillation: Secondary | ICD-10-CM

## 2023-11-15 DIAGNOSIS — I10 Essential (primary) hypertension: Secondary | ICD-10-CM | POA: Diagnosis not present

## 2023-11-15 DIAGNOSIS — I77810 Thoracic aortic ectasia: Secondary | ICD-10-CM

## 2023-11-15 MED ORDER — FLECAINIDE ACETATE 50 MG PO TABS: 50.0000 mg | ORAL_TABLET | Freq: Two times a day (BID) | ORAL | 3 refills | Status: DC

## 2023-11-15 NOTE — Progress Notes (Unsigned)
Patient ID: Alexander Reyes, male   DOB: [birth year], 68 y.o.   MRN: [id number]     Cardiology Office Note    Date:  11/17/2023   ID:  Alexander Reyes, DOB [birth year], MRN [id number] FAA: Levester Fresh. 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  He has done quite well on atrial fibrillation antiarrhythmic therapy with flecainide and his episodes of palpitations have been very infrequent.  He underwent routine surveillance treadmill stress test 11/03/2023 and unfortunately showed that he had significant QRS duration increased during exercise.  We have talked about discontinuing his flecainide due to the risk of proarrhythmic effects.  He is here to discuss alternatives.  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  We tried to replace the flecainide with Multaq, but the cost was prohibitive.  It would also be very difficult for him to be hospitalized for 72 hours to start sotalol or dofetilide due to his job.  We have discussed the possibility of treatment with amiodarone but are both concerned about the potential side effects.  Propafenone is likely to be associated with the same EKG changes as flecainide.  He is on metoprolol for rate control and also takes lisinopril and amlodipine for hypertension.  He is compliant with Xarelto anticoagulation and has not had any falls or bleeding problems.  In fact he has no cardiovascular complaints whatsoever. The patient specifically denies any  chest pain at rest or with exertion, dyspnea at rest or with exertion, orthopnea, paroxysmal nocturnal dyspnea, syncope, palpitations, focal neurological deficits, intermittent claudication, lower extremity edema, unexplained weight gain, cough, hemoptysis or wheezing.   He has a chronically low HDL cholesterol, but otherwise on atorvastatin he has favorable lipid parameters.  He had a normal nuclear stress test in 2017 and a normal plain treadmill stress test in 2020.  In 2019 he had a normal left ventricle by echo (EF 55 to 60%, normal diastolic parameters), but also had a mildly dilated left atrium (end-systolic diameter 41 mm) and a borderline dilated aortic root (38 mm).  A 24-hour Holter monitor in 2017, after his admission with atrial fibrillation, showed extremely rare PACs and PVCs and was otherwise normal. An exercise myocardial perfusion study was completely normal. EF was 57%. He was able to exercise for 7 minutes and 30 seconds. Echocardiogram also showed normal left ventricular systolic function and there were no valvular abnormalities. Left ventricular walls are borderline hypertrophic. The left atrial diameter was 37 mm, at the upper limit of normal in size. While there was no overt diastolic dysfunction, his tissue Doppler early diastolic velocities were slightly low. Estimated PA pressure was normal.  He has a long-standing history of systemic hypertension dating back at least 20 years. He has strong family history of hypertension on both maternal and paternal sides. 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 The workup was normal. He has never had a myocardial infarction,  congestive heart failure, stroke, peripheral embolic events, TIA or other major serious medical problems. He does take a statin for hyperlipidemia. He does not smoke he does not have diabetes mellitus.    Past Medical  History:  Diagnosis Date   Fibromyalgia    Hypertension    Mixed dyslipidemia     Past Surgical History:  Procedure Laterality Date   FOOT SURGERY     2009   HERNIA REPAIR     2006    Current Medications: Outpatient Medications Prior to Visit  Medication Sig Dispense Refill   atorvastatin (LIPITOR) 40 MG tablet Take 40 mg by mouth daily.     diltiazem (CARDIZEM) 30 MG tablet Take 0.5 tablets (15 mg total) by mouth daily as needed (for heart rate greater than 100 for more than 1 hour). 30 tablet 2   lisinopril (PRINIVIL,ZESTRIL) 40 MG tablet Take 40 mg by mouth daily.     metoprolol succinate (TOPROL-XL) 100 MG 24 hr tablet Take 1 tablet (100 mg total) by mouth daily. Take with or immediately following a meal. 30 tablet 1   Omega-3 Fatty Acids (FISH OIL) 1000 MG CAPS Take 1,000 mg by mouth daily.     rivaroxaban (XARELTO) 20 MG TABS tablet Take 1 tablet (20 mg total) by mouth daily with supper. 90 tablet 1   amLODipine (NORVASC) 10 MG tablet Take 1 tablet (10 mg total) by mouth daily. 30 tablet 6         No facility-administered medications prior to visit.      Allergies:   Patient has no known allergies.   Social History   Socioeconomic History   Marital status: Single    Spouse name: Not on file   Number of children: Not on file   Years of education: Not on file   Highest education level: Not on file  Occupational History   Not on file  Tobacco Use   Smoking status: Never   Smokeless tobacco: Never  Substance and Sexual Activity   Alcohol use: Yes    Alcohol/week: 4.0 standard drinks of alcohol    Types: 4 Cans of beer per week    Comment: occasional   Drug use: No   Sexual activity: Not on file  Other Topics Concern   Not on file  Social History Narrative   Not on file   Social Drivers of Health   Financial Resource Strain: Not on file  Food Insecurity: Not on file  Transportation Needs: Not on file  Physical Activity: Not on file  Stress: Not on file   Social Connections: Not on file     Family History:  The patient's family history includes Heart attack in his father; Hypertension in his father and mother; Kidney disease in his brother, father, and sister.   ROS:   Please see the history of present illness.    ROS  All other systems are reviewed and are negative.   PHYSICAL EXAM:   VS:  BP 134/66   Pulse 60   Ht 5\' 9"  (1.753 m)   Wt 198 lb 3.2 oz (89.9 kg)   SpO2 95%   BMI 29.27 kg/m      General: Alert, oriented x3, no distress, mildly overweight Head: no evidence of trauma, PERRL, EOMI, no exophtalmos or lid lag, no myxedema, no xanthelasma; normal ears, nose and oropharynx Neck: normal jugular venous pulsations and no hepatojugular reflux; brisk carotid pulses without delay and no carotid bruits Chest: clear to auscultation, no  signs of consolidation by percussion or palpation, normal fremitus, symmetrical and full respiratory excursions Cardiovascular: normal position and quality of the apical impulse, regular rhythm, normal first and second heart sounds, no murmurs, rubs or gallops Abdomen: no tenderness or distention, no masses by palpation, no abnormal pulsatility or arterial bruits, normal bowel sounds, no hepatosplenomegaly Extremities: no clubbing, cyanosis or edema; 2+ radial, ulnar and brachial pulses bilaterally; 2+ right femoral, posterior tibial and dorsalis pedis pulses; 2+ left femoral, posterior tibial and dorsalis pedis pulses; no subclavian or femoral bruits Neurological: grossly nonfocal Psych: Normal mood and affect   Wt Readings from Last 3 Encounters:  11/15/23 198 lb 3.2 oz (89.9 kg)  10/01/23 193 lb 6.4 oz (87.7 kg)  01/11/23 192 lb 3.2 oz (87.2 kg)    Studies/Labs Reviewed:   EKG:    EKG Interpretation Date/Time:  Monday November 15 2023 15:34:04 EST Ventricular Rate:  60 PR Interval:  168 QRS Duration:  108 QT Interval:  454 QTC Calculation: 454 R Axis:   17  Text  Interpretation: Normal sinus rhythm Low voltage QRS T wave abnormality, consider anterolateral ischemia When compared with ECG of 01-Oct-2023 08:11, QRS axis Shifted left Confirmed by Tyechia Allmendinger ([id number]) on 11/15/2023 4:06:35 PM         10/01/2023: BUN 12; Creatinine, Ser 0.84; Hemoglobin 15.0; Magnesium 2.2; Platelets 308; Potassium 4.5; Sodium 141   07/04/2020 Total cholesterol 131, HDL 35, LDL 68, triglycerides 116 Hemoglobin 14.3, creatinine 0.8, potassium 4.5, normal liver function test, TSH 1.1 08/06/2021 Cholesterol 168, HDL 30, LDL 91, triglycerides 280 Hemoglobin 14.7, creatinine 0.82, potassium 4.5   ASSESSMENT:    1. Paroxysmal atrial fibrillation (HCC)   2. Encounter for monitoring flecainide therapy   3. Essential hypertension   4. Dyslipidemia (high LDL; low HDL)   5. Aortic root dilation (HCC)   6. Acquired thrombophilia (HCC)      PLAN:  In order of problems listed above:   1. AFib: He is done very well on flecainide for several years, but the recent treadmill stress test raises the concern for possible pro arrhythmia.  I think he is an excellent candidate for A-fib ablation in view of his relatively young age in the absence of significant structural cardiac abnormalities.  CHA2DS2-VASc score is 2 for hypertension, age.  Compliant with anticoagulation.   2. Flecainide: Very mild QRS interval addition precedes treatment with flecainide.  However during stress testing there was significant broadening of the QRS.  For the time being we have decided to use half of the previous dose of flecainide.  So far this is working well.  Referring him to EP to discuss ablation. 3. HTN: Well-controlled. 4. HLP: I do not have his most recent lipid profile.  In the past this striking abnormalities were very low HDL and borderline hypertriglyceridemia.  However his LDL was excellent on statin therapy.   those pounds and improve his lipid profile.   5. Ao dilation: Borderline,  asymptomatic.  If he is found to be a good candidate for ablation he will have a cardiac CT do not allow [place] to read measuring the aorta. 6. Anticoagulation: Denies falls and has not had any bleeding complications.   Medication Adjustments/Labs and Tests Ordered: Current medicines are reviewed at length with the patient today.  Concerns regarding medicines are outlined above.  Medication changes, Labs and Tests ordered today are listed in the Patient Instructions below. Patient Instructions  Medication Instructions:  Flecainide 50 mg twice a day *If  you need a refill on your cardiac medications before your next appointment, please call your pharmacy*  Follow-Up: At [facility], you and your health needs are our priority.  As part of our continuing mission to provide you with exceptional heart care, we have created designated Provider Care Teams.  These Care Teams include your primary Cardiologist (physician) and Advanced Practice Providers (APPs -  Physician Assistants and Nurse Practitioners) who all work together to provide you with the care you need, when you need it.  We recommend signing up for the patient portal called "MyChart".  Sign up information is provided on this After Visit Summary.  MyChart is used to connect with patients for Virtual Visits (Telemedicine).  Patients are able to view lab/test results, encounter notes, upcoming appointments, etc.  Non-urgent messages can be sent to your provider as well.   To learn more about what you can do with MyChart, go to [email or web address].    Your next appointment:   6 month(s)  Provider:   Thurmon Fair, MD              Signed, Thurmon Fair, MD  11/17/2023 4:33 PM    [facility] [address] [place], [place], [place]  [address] Phone: [phone]; Fax: [phone]

## 2023-11-15 NOTE — Patient Instructions (Addendum)
Medication Instructions:  Flecainide 50 mg twice a day *If you need a refill on your cardiac medications before your next appointment, please call your pharmacy*  Follow-Up: At [facility], you and your health needs are our priority.  As part of our continuing mission to provide you with exceptional heart care, we have created designated Provider Care Teams.  These Care Teams include your primary Cardiologist (physician) and Advanced Practice Providers (APPs -  Physician Assistants and Nurse Practitioners) who all work together to provide you with the care you need, when you need it.  We recommend signing up for the patient portal called "MyChart".  Sign up information is provided on this After Visit Summary.  MyChart is used to connect with patients for Virtual Visits (Telemedicine).  Patients are able to view lab/test results, encounter notes, upcoming appointments, etc.  Non-urgent messages can be sent to your provider as well.   To learn more about what you can do with MyChart, go to [email or web address].    Your next appointment:   6 month(s)  Provider:   Thurmon Fair, MD

## 2023-11-22 DIAGNOSIS — Z125 Encounter for screening for malignant neoplasm of prostate: Secondary | ICD-10-CM | POA: Diagnosis not present

## 2023-11-22 DIAGNOSIS — I10 Essential (primary) hypertension: Secondary | ICD-10-CM | POA: Diagnosis not present

## 2023-11-22 DIAGNOSIS — E782 Mixed hyperlipidemia: Secondary | ICD-10-CM | POA: Diagnosis not present

## 2023-11-22 DIAGNOSIS — Z0000 Encounter for general adult medical examination without abnormal findings: Secondary | ICD-10-CM | POA: Diagnosis not present

## 2024-01-20 DIAGNOSIS — H40053 Ocular hypertension, bilateral: Secondary | ICD-10-CM | POA: Diagnosis not present

## 2024-01-20 NOTE — Progress Notes (Unsigned)
 Electrophysiology Office Note:   Date:  01/21/2024  ID:  Eldon Zietlow, DOB [birth year], MRN [id number]  Primary Cardiologist: Thurmon Fair, MD Electrophysiologist: Nobie Putnam, MD      History of Present Illness:   Coulson Wehner is a 68 y.o. male with h/o paroxysmal atrial fibrillation, systemic hypertension, mild dilation of the sinuses of Valsalva (43 mm) who is being seen today for evaluation of his atrial fibrillation at the request of Dr. Royann Shivers.   Discussed the use of AI scribe software for clinical note transcription with the patient, who gave verbal consent to proceed.  History of Present Illness Arlen Dupuis is a 68 year old male with atrial fibrillation who presents for evaluation of medication management. He has a history of atrial fibrillation, previously well-controlled on flecainide. A recent stress test on 11/03/23 showed QRS widening during exercise, prompting a reduction in his dose to 50 mg. Following the dose adjustment, he experienced adjustment symptoms for the first two days, during which his Cardia device confirmed atrial fibrillation. These episodes occurred at night, typically around 3 to 4 AM, but resolved after taking his morning dose of flecainide. Since then, he has not experienced any further episodes and has been symptom-free for the past two to three months. 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 No new or acute complaints today.  Review of systems complete and found to be negative unless listed in HPI.   EP Information / Studies Reviewed:    EKG is ordered today. Personal review as below.  EKG Interpretation Date/Time:  Friday January 21 2024 08:43:37 EDT Ventricular Rate:  57 PR Interval:  170 QRS Duration:  106 QT Interval:  440 QTC Calculation: 428 R Axis:   88  Text Interpretation: Sinus bradycardia Nonspecific T wave abnormality When  compared with ECG of 15-Nov-2023 15:34, No significant change since Confirmed by Nobie Putnam [phone]) on 01/21/2024 9:07:28 AM   EKG 01/23/16: AF   Echo 08/2018: Study Conclusions  - Left ventricle: The cavity size was normal. Systolic function was    normal. The estimated ejection fraction was in the range of 55%    to 60%. Wall motion was normal; there were no regional wall    motion abnormalities. Left ventricular diastolic function    parameters were normal.  - Left atrium: The atrium was mildly dilated.  - Atrial septum: No defect or patent foramen ovale was identified.   Risk Assessment/Calculations:    CHA2DS2-VASc Score = 2   This indicates a 2.2% annual risk of stroke. The patient's score is based upon: CHF History: 0 HTN History: 1 Diabetes History: 0 Stroke History: 0 Vascular Disease History: 0 Age Score: 1 Gender Score: 0             Physical Exam:   VS:  BP 138/82   Pulse (!) 57   Ht 5\' 9"  (1.753 m)   Wt 191 lb (86.6 kg)   SpO2 99%   BMI 28.21 kg/m    Wt Readings from Last 3 Encounters:  01/21/24 191 lb (86.6 kg)  11/15/23 198 lb 3.2 oz (89.9 kg)  10/01/23 193 lb 6.4 oz (87.7 kg)     GEN: Well nourished, well developed in no acute distress NECK: No JVD CARDIAC: Bradycardic, regular RESPIRATORY:  Clear to auscultation without rales, wheezing or rhonchi  ABDOMEN: Soft, non-distended EXTREMITIES:  No edema; No deformity  ASSESSMENT AND PLAN:    #Paroxysmal atrial fibrillation, symptomatic: He had short episode immediately after decreasing flecainide. None since.  - Discussed treatment options today for AF including antiarrhythmic drug therapy (continuing reduced dose of flecainide, changing to sotalol or Tikosyn) and ablation. Discussed risks, recovery and likelihood of success with each treatment strategy. Risk, benefits, and alternatives to EP study and ablation for afib were discussed. These risks include but are not limited to stroke, bleeding,  vascular damage, tamponade, perforation, damage to the esophagus, lungs, phrenic nerve and other structures, pulmonary vein stenosis, worsening renal function, coronary vasospasm and death.  The patient understands these risks and would like to remain on flecainide 50mg  twice daily for now. We will repeat exercise tolerance test to ensure no QRS widening at lower dose. I suspect 50mg  dosing will be insufficient for him to maintain sinus rhythm long-term. If he has recurrence, he is leaning towards ablation. He would be an excellent ablation candidate should he chose to do so.  - Continue metoprolol XL 50mg  once daily. - Continue flecainide 50mg  BID.   #Secondary hypercoagulable state due to atrial fibrillation: CHADSVASC score of 2.  -Continue Xarelto 20 mg once daily.  #High risk medication use: Anti-arrhythmic drug therapy - flecainide.  Patient had QRS widening on 100 mg twice daily of flecainide during exercise testing.  This was decreased to 50 mg twice daily which he appears to be tolerating. -EKG today shows narrow QRS, . Sinus bradycardia.  -We will repeat exercise treadmill on flecainide 50mg  to ensure no QRS widening at lower dose.   #Hypertension -At goal today.  Recommend checking blood pressures 1-2 times per week at home and recording the values.  Recommend bringing these recordings to the primary care physician.  Follow up with Dr. Jimmey Ralph  as needed.   Signed, Nobie Putnam, MD

## 2024-01-21 ENCOUNTER — Encounter: Payer: Self-pay | Admitting: Cardiology

## 2024-01-21 ENCOUNTER — Ambulatory Visit: Payer: Medicare Other | Attending: Internal Medicine | Admitting: Cardiology

## 2024-01-21 VITALS — BP 138/82 | HR 57 | Ht 69.0 in | Wt 191.0 lb

## 2024-01-21 DIAGNOSIS — Z79899 Other long term (current) drug therapy: Secondary | ICD-10-CM

## 2024-01-21 DIAGNOSIS — I10 Essential (primary) hypertension: Secondary | ICD-10-CM

## 2024-01-21 DIAGNOSIS — I480 Paroxysmal atrial fibrillation: Secondary | ICD-10-CM | POA: Diagnosis not present

## 2024-01-21 DIAGNOSIS — D6869 Other thrombophilia: Secondary | ICD-10-CM | POA: Diagnosis not present

## 2024-01-21 NOTE — Patient Instructions (Signed)
 Medication Instructions:  Your physician recommends that you continue on your current medications as directed. Please refer to the Current Medication list given to you today.  *If you need a refill on your cardiac medications before your next appointment, please call your pharmacy*   Testing/Procedures: Exercise Treadmill Test Your physician has requested that you have an exercise tolerance test. For further information please visit [email or web address] Please also follow instruction sheet, as given.   Follow-Up: At [facility], you and your health needs are our priority.  As part of our continuing mission to provide you with exceptional heart care, our providers are all part of one team.  This team includes your primary Cardiologist (physician) and Advanced Practice Providers or APPs (Physician Assistants and Nurse Practitioners) who all work together to provide you with the care you need, when you need it.  Your next appointment:   As needed with Dr. Jimmey Ralph       1st Floor: - Lobby - Registration  - Pharmacy  - Lab - Cafe  2nd Floor: - PV Lab - Diagnostic Testing (echo, CT, nuclear med)  3rd Floor: - Vacant  4th Floor: - TCTS (cardiothoracic surgery) - AFib Clinic - Structural Heart Clinic - Vascular Surgery  - Vascular Ultrasound  5th Floor: - HeartCare Cardiology (general and EP) - Clinical Pharmacy for coumadin, hypertension, lipid, weight-loss medications, and med management appointments    Valet parking services will be available as well.

## 2024-01-29 ENCOUNTER — Encounter: Payer: Self-pay | Admitting: Cardiology

## 2024-02-02 ENCOUNTER — Ambulatory Visit: Attending: Cardiology

## 2024-02-02 DIAGNOSIS — I480 Paroxysmal atrial fibrillation: Secondary | ICD-10-CM | POA: Diagnosis not present

## 2024-02-02 LAB — EXERCISE TOLERANCE TEST
Angina Index: 0
Base ST Depression (mm): 0 mm
Duke Treadmill Score: 6
Estimated workload: 7
Exercise duration (min): 6 min
Exercise duration (sec): 0 s
MPHR: 155 {beats}/min
Peak HR: 133 {beats}/min
Percent HR: 85 %
RPE: 17
Rest HR: 60 {beats}/min
ST Depression (mm): 0 mm

## 2024-02-06 ENCOUNTER — Encounter: Payer: Self-pay | Admitting: Cardiology

## 2024-03-23 ENCOUNTER — Other Ambulatory Visit: Payer: Self-pay | Admitting: Cardiovascular Disease

## 2024-04-04 ENCOUNTER — Encounter: Payer: Self-pay | Admitting: Cardiovascular Disease

## 2024-04-04 NOTE — Telephone Encounter (Signed)
 Please advise for testing that needs to be ordered.

## 2024-04-05 NOTE — Telephone Encounter (Signed)
 Spoke with the patient. He is available to have the 24 hr Holter monitor placed on Friday at 0900- Coral Der will place the monitor on the patient.  There is already an order for the monitor by Katlyn West, NP from December 2024.

## 2024-04-06 ENCOUNTER — Telehealth: Payer: Self-pay | Admitting: Cardiovascular Disease

## 2024-04-06 ENCOUNTER — Encounter

## 2024-04-06 NOTE — Telephone Encounter (Signed)
 Paper Work Dropped Off: A-fib Status Summary  Date: 04/06/24  Location of paper:  Provider Mailbox

## 2024-04-07 ENCOUNTER — Ambulatory Visit: Attending: Cardiology

## 2024-04-07 DIAGNOSIS — I480 Paroxysmal atrial fibrillation: Secondary | ICD-10-CM | POA: Diagnosis not present

## 2024-04-07 NOTE — Progress Notes (Unsigned)
 [employer] 24 hour holter monitor serial # [id number] from office inventory applied to patient.  Dr. Alvis Ba to read.

## 2024-04-17 DIAGNOSIS — I480 Paroxysmal atrial fibrillation: Secondary | ICD-10-CM

## 2024-04-18 ENCOUNTER — Ambulatory Visit: Payer: Self-pay | Admitting: Cardiology

## 2024-04-20 NOTE — Telephone Encounter (Signed)
 Patient is returning phone call.

## 2024-04-20 NOTE — Telephone Encounter (Signed)
 Left message to call back

## 2024-04-20 NOTE — Telephone Encounter (Signed)
-----   Message from Katlyn D West sent at 04/18/2024  6:32 PM EDT ----- Please let Mr. Alexander Reyes know that his cardiac monitor showed that his predominant rhythm was sinus bradycardia, with an average heart rate of 52 bpm. It showed that his heart rate is not increasing  significantly throughout the day which may be related to use of metoprolol . There was no evidence of atrial fibrillation or significant arrhythmias. There was a single early beat from the bottom  chambers of the heart.   Reviewed with Dr. Francyne, if he is having symptoms of low heart rate such as dizziness, lightheadedness or increased fatigue lets reduce his metoprolol  succinate to 25 mg daily. If he is  asymptomatic and doing well, continue current medications.  ----- Message ----- From: Francyne Headland, MD Sent: 04/17/2024   2:39 PM EDT To: Katlyn D West, NP

## 2024-05-29 DIAGNOSIS — E782 Mixed hyperlipidemia: Secondary | ICD-10-CM | POA: Diagnosis not present

## 2024-05-29 DIAGNOSIS — I77810 Thoracic aortic ectasia: Secondary | ICD-10-CM | POA: Diagnosis not present

## 2024-05-29 DIAGNOSIS — I480 Paroxysmal atrial fibrillation: Secondary | ICD-10-CM | POA: Diagnosis not present

## 2024-05-29 DIAGNOSIS — I10 Essential (primary) hypertension: Secondary | ICD-10-CM | POA: Diagnosis not present

## 2024-06-06 DIAGNOSIS — Z0289 Encounter for other administrative examinations: Secondary | ICD-10-CM | POA: Diagnosis not present

## 2024-06-17 ENCOUNTER — Other Ambulatory Visit: Payer: Self-pay | Admitting: Medical Genetics

## 2024-06-20 ENCOUNTER — Other Ambulatory Visit (HOSPITAL_COMMUNITY)
Admission: RE | Admit: 2024-06-20 | Discharge: 2024-06-20 | Disposition: A | Discharge status: Home / Self Care | Payer: Self-pay | Source: Ambulatory Visit | Attending: Medical Genetics | Admitting: Medical Genetics

## 2024-06-27 LAB — GENECONNECT MOLECULAR SCREEN: Genetic Analysis Overall Interpretation: NEGATIVE

## 2024-08-10 ENCOUNTER — Other Ambulatory Visit: Payer: Self-pay

## 2024-08-14 MED ORDER — FLECAINIDE ACETATE 50 MG PO TABS: 50.0000 mg | ORAL_TABLET | Freq: Two times a day (BID) | ORAL | 0 refills | Status: DC

## 2024-08-28 DIAGNOSIS — H0288A Meibomian gland dysfunction right eye, upper and lower eyelids: Secondary | ICD-10-CM | POA: Diagnosis not present

## 2024-08-28 DIAGNOSIS — H5202 Hypermetropia, left eye: Secondary | ICD-10-CM | POA: Diagnosis not present

## 2024-08-28 DIAGNOSIS — H5211 Myopia, right eye: Secondary | ICD-10-CM | POA: Diagnosis not present

## 2024-08-28 DIAGNOSIS — H16223 Keratoconjunctivitis sicca, not specified as Sjogren's, bilateral: Secondary | ICD-10-CM | POA: Diagnosis not present

## 2024-08-28 DIAGNOSIS — H0288B Meibomian gland dysfunction left eye, upper and lower eyelids: Secondary | ICD-10-CM | POA: Diagnosis not present

## 2024-08-28 DIAGNOSIS — H40053 Ocular hypertension, bilateral: Secondary | ICD-10-CM | POA: Diagnosis not present

## 2024-08-28 DIAGNOSIS — H52223 Regular astigmatism, bilateral: Secondary | ICD-10-CM | POA: Diagnosis not present

## 2024-09-04 ENCOUNTER — Encounter: Payer: Self-pay | Admitting: Cardiovascular Disease

## 2024-09-04 ENCOUNTER — Ambulatory Visit: Attending: Cardiovascular Disease | Admitting: Cardiovascular Disease

## 2024-09-04 VITALS — BP 150/82 | HR 60 | Ht 69.0 in | Wt 202.0 lb

## 2024-09-04 DIAGNOSIS — I480 Paroxysmal atrial fibrillation: Secondary | ICD-10-CM | POA: Diagnosis not present

## 2024-09-04 DIAGNOSIS — D6869 Other thrombophilia: Secondary | ICD-10-CM | POA: Diagnosis not present

## 2024-09-04 DIAGNOSIS — Z5181 Encounter for therapeutic drug level monitoring: Secondary | ICD-10-CM

## 2024-09-04 DIAGNOSIS — I447 Left bundle-branch block, unspecified: Secondary | ICD-10-CM | POA: Insufficient documentation

## 2024-09-04 DIAGNOSIS — Z79899 Other long term (current) drug therapy: Secondary | ICD-10-CM

## 2024-09-04 DIAGNOSIS — I10 Essential (primary) hypertension: Secondary | ICD-10-CM

## 2024-09-04 DIAGNOSIS — E785 Hyperlipidemia, unspecified: Secondary | ICD-10-CM

## 2024-09-04 DIAGNOSIS — I77810 Thoracic aortic ectasia: Secondary | ICD-10-CM

## 2024-09-04 NOTE — Patient Instructions (Addendum)
 Medication Instructions:  - No changes  *If you need a refill on your cardiac medications before your next appointment, please call your pharmacy*  Lab Work: - None ordered  Testing/Procedures: Your physician has recommended that you wear a holter monitor. Holter monitors are medical devices that record the heart's electrical activity. Doctors most often use these monitors to diagnose arrhythmias. Arrhythmias are problems with the speed or rhythm of the heartbeat. The monitor is a small, portable device. You can wear one while you do your normal daily activities. This is usually used to diagnose what is causing palpitations/syncope (passing out). We will schedule you for an appointment in May 2026 to apply this monitor.  Follow-Up: At [facility], you and your health needs are our priority.  As part of our continuing mission to provide you with exceptional heart care, our providers are all part of one team.  This team includes your primary Cardiologist (physician) and Advanced Practice Providers or APPs (Physician Assistants and Nurse Practitioners) who all work together to provide you with the care you need, when you need it.  Your next appointment:   1 year(s)  Provider:   Jerel Balding, MD    We recommend signing up for the patient portal called MyChart.  Sign up information is provided on this After Visit Summary.  MyChart is used to connect with patients for Virtual Visits (Telemedicine).  Patients are able to view lab/test results, encounter notes, upcoming appointments, etc.  Non-urgent messages can be sent to your provider as well.   To learn more about what you can do with MyChart, go to [email or web address].

## 2024-09-04 NOTE — Progress Notes (Signed)
 Patient ID: Alexander Reyes, male   DOB: 29-Jul-1956, 68 y.o.   MRN: [id number]     Cardiology Office Note    Date:  09/04/2024   ID:  Alexander Reyes, DOB January 14, 1956, MRN [id number] FAA: Maple FERNS. 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  He has had excellent suppression of atrial fibrillation on flecainide .  Treadmill stress test at the beginning of this year showed QRS widening during exercise so we reduced the dose of flecainide  to 50 mg twice daily.  This has provided good symptom control.  A repeat treadmill stress test did not show the same QRS widening, so he has continued on this dose of antiarrhythmic medication.  He was seen by Dr. Kennyth, with a plan to consider ablation if his symptoms are no longer controlled by the antiarrhythmic.  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  He denies dyspnea or angina at rest or with activity, palpitations, dizziness, syncope, lower extreme edema, claudication or focal neurological events.  Physical exercise is limited by arthritis, but he has bought a bicycle and is riding it 20 minutes at a time.  His blood pressure is a little high today, but at home it is consistently in the 120s/70s.  He has gained a little weight and is getting close to a BMI of 30.  He has chronically low HDL cholesterol (31 last checked), but on statin his LDL cholesterol is in acceptable range.  He has a chronically low HDL cholesterol, but otherwise on atorvastatin he has favorable lipid parameters. He had a normal nuclear stress test in 2017.  In 2019 he had a normal  left ventricle by echo (EF 55 to 60%, normal diastolic parameters), but also had a mildly dilated left atrium (end-systolic diameter 41 mm) and a borderline dilated aortic root (38 mm).  The most recent treadmill stress test on 02/02/2024 using the Bruce protocol showed no evidence of angina or ECG ST segment changes to suggest ischemia.  A 24-hour Holter monitor in 2017, after his admission with atrial fibrillation, showed extremely rare PACs and PVCs and was otherwise normal. An exercise myocardial perfusion study was completely normal. EF was 57%. He was able to exercise for 7 minutes and 30 seconds. Echocardiogram also showed normal left ventricular systolic function and there were no valvular abnormalities. Left ventricular walls are borderline hypertrophic. The left atrial diameter was 37 mm, at the upper limit of normal in size. While there was no overt diastolic dysfunction, his tissue Doppler early diastolic velocities were slightly low. Estimated PA pressure was normal.  He has a long-standing history of systemic hypertension dating back at least 20 years. He has strong family history of hypertension on both maternal and paternal sides. 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 The workup was normal. He has never had a myocardial infarction, congestive heart failure, stroke, peripheral embolic events, TIA or other major serious medical problems. He does take a statin for hyperlipidemia. He does not smoke he does not have diabetes mellitus.  Past Medical History:  Diagnosis Date   Fibromyalgia    Hypertension    Mixed dyslipidemia     Past Surgical History:  Procedure Laterality Date   FOOT SURGERY     2009   HERNIA REPAIR     2006    Current Medications: Outpatient Medications Prior to Visit  Medication Sig Dispense Refill   atorvastatin (LIPITOR) 40 MG tablet Take 40 mg by mouth  daily.     diltiazem  (CARDIZEM ) 30 MG tablet Take 0.5 tablets (15 mg total) by mouth daily as needed (for heart rate greater than 100 for more than 1 hour). 30 tablet 2   lisinopril (PRINIVIL,ZESTRIL) 40 MG tablet Take 40 mg by mouth daily.     metoprolol  succinate (TOPROL -XL) 100 MG 24 hr tablet Take 1 tablet (100 mg total) by mouth daily. Take with or immediately following a meal. 30 tablet 1   Omega-3 Fatty Acids (FISH OIL) 1000 MG CAPS Take 1,000 mg by mouth daily.     rivaroxaban  (XARELTO ) 20 MG TABS tablet Take 1 tablet (20 mg total) by mouth daily with supper. 90 tablet 1   amLODipine  (NORVASC ) 10 MG tablet Take 1 tablet (10 mg total) by mouth daily. 30 tablet 6         No facility-administered medications prior to visit.      Allergies:   Patient has no known allergies.    Family History:  The patient's family history includes Heart attack in his father; Hypertension in his father and mother; Kidney disease in his brother, father, and sister.   ROS:   Please see the history of present illness.    ROS  All other systems are reviewed and are negative.   PHYSICAL EXAM:   VS:  BP (!) 150/82   Pulse 60   Ht 5' 9 (1.753 m)   Wt 202 lb (91.6 kg)   SpO2 98%   BMI 29.83 kg/m      General: Alert, oriented x3, no distress, overweight, borderline obese Head: no evidence of trauma, PERRL, EOMI, no exophtalmos or lid lag, no myxedema, no xanthelasma; normal ears, nose and oropharynx  Neck: normal jugular venous pulsations and no hepatojugular reflux; brisk carotid pulses without delay and no carotid bruits Chest: clear to auscultation, no signs of consolidation by percussion or palpation, normal fremitus, symmetrical and full respiratory excursions Cardiovascular: normal position and quality of the apical impulse, regular rhythm, normal first and second heart sounds, no murmurs, rubs or gallops Abdomen: no tenderness or distention, no masses by palpation, no abnormal pulsatility or  arterial bruits, normal bowel sounds, no hepatosplenomegaly Extremities: no clubbing, cyanosis or edema; 2+ radial, ulnar and brachial pulses bilaterally; 2+ right femoral, posterior tibial and dorsalis pedis pulses; 2+ left femoral, posterior tibial and dorsalis pedis pulses; no subclavian or femoral bruits Neurological: grossly nonfocal Psych: Normal mood and affect    Wt Readings from Last 3 Encounters:  09/04/24 202 lb (91.6 kg)  01/21/24 191 lb (86.6 kg)  11/15/23 198 lb 3.2 oz (89.9 kg)    Studies/Labs Reviewed:   EKG:    EKG Interpretation Date/Time:  Monday September 04 2024 08:56:10 EST Ventricular Rate:  60 PR Interval:  178 QRS Duration:  108 QT Interval:  466 QTC Calculation: 466 R Axis:   5  Text Interpretation: Normal sinus rhythm Low voltage QRS Incomplete left bundle branch block Nonspecific ST and T wave abnormality Prolonged QT When compared with ECG of 21-Jan-2024 08:43, Incomplete left bundle branch  block is now Present Confirmed by Lashundra Shiveley [phone]) on 09/04/2024 9:03:30 AM         10/01/2023: BUN 12; Creatinine, Ser 0.84; Hemoglobin 15.0; Magnesium 2.2; Platelets 308; Potassium 4.5; Sodium 141   07/04/2020 Total cholesterol 131, HDL 35, LDL 68, triglycerides 116 Hemoglobin 14.3, creatinine 0.8, potassium 4.5, normal liver function test, TSH 1.1 08/06/2021 Cholesterol 168, HDL 30, LDL 91, triglycerides 280 Hemoglobin 14.7, creatinine 0.82, potassium 4.5 11/22/2023 Cholesterol 163, HDL 30, LDL 94, triglycerides 230  ASSESSMENT:    1. Paroxysmal atrial fibrillation (HCC)   2. Hypercoagulable state due to paroxysmal atrial fibrillation (HCC)   3. Incomplete left bundle branch block (LBBB)   4. Encounter for monitoring flecainide  therapy   5. Essential hypertension   6. Dyslipidemia (high LDL; low HDL)   7. Aortic root dilation      PLAN:  In order of problems listed above:   AFib: The dose of flecainide  was reduced due to QRS changes  during exercise, but he is still experiencing excellent arrhythmia control on the lower dose.  If his arrhythmia symptoms worsen he is an excellent candidate for ablation.  CHA2DS2-VASc score is 2 for hypertension, age.  Compliant with anticoagulation.  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 Anticoagulation: No bleeding problems on Xarelto . Flecainide : He has chronic mild QRS prolongation that precedes treatment with flecainide .  He had excessive QRS prolongation and exercise on the 100 mg twice daily dose, but did not have this problem on the 50 mg twice daily dose.  Multaq  was not affordable. IVCD/incomplete LBBB: QRS duration around 108 ms at baseline. HTN: Elevated today, but this is atypical.  Usually well-controlled.  Asked him to keep a close eye on this at home.  He has gained some weight.  He is on maximum doses of amlodipine  and lisinopril and is unlikely to tolerate more metoprolol  due to his heart rate.  If necessary we will add a low-dose of thiazide diuretic. HLP: LDL acceptable, but has chronically low HDL and mildly elevated triglycerides consistent with insulin resistance/metabolic syndrome.  Strongly recommend continuing physical exercise and trying to lose weight.  Recommend a minimum of 2.5 hours of exercise a week. Ao dilation: Borderline measured at 40 mm on echocardiogram from 2018, asymptomatic.  If he is found to be a good candidate for ablation he will have a cardiac CT do not allow us  to read measuring the aorta.  Medication Adjustments/Labs and Tests Ordered: Current medicines are reviewed at length with the patient today.  Concerns regarding medicines are outlined above.  Medication changes, Labs and Tests ordered today are listed in the Patient Instructions below. Patient Instructions  Medication Instructions:  - No changes  *If you need a refill on your cardiac medications before your next appointment, please call your  pharmacy*  Lab Work: - None ordered  Testing/Procedures: Your physician has recommended that you wear a holter monitor. Holter monitors are medical devices that record the heart's electrical activity. Doctors most often use these monitors to diagnose arrhythmias. Arrhythmias are problems with the speed or rhythm of the heartbeat. The monitor is a small, portable device. You can wear one while you do your normal daily activities. This is usually used to diagnose what is causing palpitations/syncope (passing out). We will schedule you for an appointment in May 2026 to apply this monitor.  Follow-Up: At [facility], you and your health needs are our priority.  As part  of our continuing mission to provide you with exceptional heart care, our providers are all part of one team.  This team includes your primary Cardiologist (physician) and Advanced Practice Providers or APPs (Physician Assistants and Nurse Practitioners) who all work together to provide you with the care you need, when you need it.  Your next appointment:   1 year(s)  Provider:   Jerel Balding, MD    We recommend signing up for the patient portal called MyChart.  Sign up information is provided on this After Visit Summary.  MyChart is used to connect with patients for Virtual Visits (Telemedicine).  Patients are able to view lab/test results, encounter notes, upcoming appointments, etc.  Non-urgent messages can be sent to your provider as well.   To learn more about what you can do with MyChart, go to [email or web address].          Signed, Jerel Balding, MD  09/04/2024 10:54 AM    [facility] [address] [place], [place], [place]  [address] Phone: [phone]; Fax: [phone]

## 2024-11-13 ENCOUNTER — Other Ambulatory Visit: Payer: Self-pay | Admitting: Cardiovascular Disease

## 2024-11-16 ENCOUNTER — Encounter: Payer: Self-pay | Admitting: Cardiovascular Disease

## 2024-11-16 NOTE — Telephone Encounter (Signed)
 In accordance with refill protocols, please review and address the following requirements before this medication refill can be authorized:  Labs
